# Patient Record
Sex: Female | Born: 1990 | Race: Black or African American | Hispanic: No | Marital: Single | Smoking: Never smoker
Health system: Southern US, Community
[De-identification: ages and names within clinical notes are randomized; demographics above are authoritative.]

## PROBLEM LIST (undated history)

## (undated) ENCOUNTER — Inpatient Hospital Stay (HOSPITAL_COMMUNITY): Payer: Self-pay

## (undated) DIAGNOSIS — A5901 Trichomonal vulvovaginitis: Secondary | ICD-10-CM

## (undated) DIAGNOSIS — Z8669 Personal history of other diseases of the nervous system and sense organs: Secondary | ICD-10-CM

## (undated) DIAGNOSIS — B009 Herpesviral infection, unspecified: Secondary | ICD-10-CM

## (undated) DIAGNOSIS — K8020 Calculus of gallbladder without cholecystitis without obstruction: Secondary | ICD-10-CM

## (undated) HISTORY — DX: Personal history of other diseases of the nervous system and sense organs: Z86.69

## (undated) HISTORY — PX: WISDOM TOOTH EXTRACTION: SHX21

## (undated) HISTORY — DX: Trichomonal vulvovaginitis: A59.01

## (undated) HISTORY — PX: TUBAL LIGATION: SHX77

## (undated) HISTORY — PX: NO PAST SURGERIES: SHX2092

## (undated) HISTORY — DX: Herpesviral infection, unspecified: B00.9

---
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
## 2008-01-28 ENCOUNTER — Inpatient Hospital Stay (HOSPITAL_COMMUNITY): Admission: AD | Admit: 2008-01-28 | Discharge: 2008-01-29 | Payer: Self-pay | Admitting: Obstetrics

## 2008-02-02 ENCOUNTER — Ambulatory Visit (HOSPITAL_COMMUNITY): Admission: RE | Admit: 2008-02-02 | Discharge: 2008-02-02 | Payer: Self-pay | Admitting: Obstetrics

## 2008-06-17 ENCOUNTER — Inpatient Hospital Stay (HOSPITAL_COMMUNITY): Admission: AD | Admit: 2008-06-17 | Discharge: 2008-06-17 | Payer: Self-pay | Admitting: Obstetrics

## 2008-06-19 ENCOUNTER — Inpatient Hospital Stay (HOSPITAL_COMMUNITY): Admission: AD | Admit: 2008-06-19 | Discharge: 2008-06-19 | Payer: Self-pay | Admitting: Obstetrics

## 2008-06-25 ENCOUNTER — Inpatient Hospital Stay (HOSPITAL_COMMUNITY): Admission: AD | Admit: 2008-06-25 | Discharge: 2008-06-26 | Payer: Self-pay | Admitting: Obstetrics

## 2008-06-27 ENCOUNTER — Inpatient Hospital Stay (HOSPITAL_COMMUNITY): Admission: AD | Admit: 2008-06-27 | Discharge: 2008-06-29 | Payer: Self-pay | Admitting: Obstetrics

## 2009-06-04 ENCOUNTER — Emergency Department (HOSPITAL_COMMUNITY): Admission: EM | Admit: 2009-06-04 | Discharge: 2009-06-04 | Payer: Self-pay | Admitting: Emergency Medicine

## 2010-07-23 LAB — RAPID STREP SCREEN (MED CTR MEBANE ONLY): Streptococcus, Group A Screen (Direct): NEGATIVE

## 2010-08-21 LAB — CBC
HCT: 35.4 % — ABNORMAL LOW (ref 36.0–46.0)
Hemoglobin: 9 g/dL — ABNORMAL LOW (ref 12.0–15.0)
MCHC: 32.9 g/dL (ref 30.0–36.0)
MCHC: 32.9 g/dL (ref 30.0–36.0)
Platelets: 189 10*3/uL (ref 150–400)
RBC: 4.26 MIL/uL (ref 3.87–5.11)
RDW: 15.2 % (ref 11.5–15.5)
WBC: 9.2 10*3/uL (ref 4.0–10.5)

## 2010-08-21 LAB — URINALYSIS, ROUTINE W REFLEX MICROSCOPIC
Glucose, UA: NEGATIVE mg/dL
Hgb urine dipstick: NEGATIVE
Ketones, ur: NEGATIVE mg/dL
Ketones, ur: NEGATIVE mg/dL
Specific Gravity, Urine: 1.015 (ref 1.005–1.030)
Urobilinogen, UA: 0.2 mg/dL (ref 0.0–1.0)
Urobilinogen, UA: 0.2 mg/dL (ref 0.0–1.0)
pH: 7.5 (ref 5.0–8.0)
pH: 6.5 (ref 5.0–8.0)

## 2010-08-21 LAB — COMPREHENSIVE METABOLIC PANEL
ALT: 10 U/L (ref 0–35)
AST: 20 U/L (ref 0–37)
Albumin: 3.3 g/dL — ABNORMAL LOW (ref 3.5–5.2)
Calcium: 9 mg/dL (ref 8.4–10.5)
GFR calc Af Amer: >60 mL/min (ref >60)
Sodium: 134 mEq/L — ABNORMAL LOW (ref 135–145)
Total Protein: 7.1 g/dL (ref 6.0–8.3)

## 2010-08-21 LAB — AMYLASE: Amylase: 143 U/L — ABNORMAL HIGH (ref 27–131)

## 2010-08-21 LAB — RPR: RPR Ser Ql: NONREACTIVE

## 2010-08-21 LAB — LIPASE, BLOOD: Lipase: 26 U/L (ref 11–59)

## 2010-11-09 ENCOUNTER — Inpatient Hospital Stay (HOSPITAL_COMMUNITY)
Admission: EM | Admit: 2010-11-09 | Discharge: 2010-11-09 | Disposition: A | Discharge status: Home / Self Care | Payer: Self-pay | Source: Ambulatory Visit | Attending: Obstetrics | Admitting: Obstetrics

## 2010-11-09 DIAGNOSIS — R112 Nausea with vomiting, unspecified: Secondary | ICD-10-CM

## 2010-11-09 DIAGNOSIS — R109 Unspecified abdominal pain: Secondary | ICD-10-CM

## 2010-11-09 LAB — URINALYSIS, ROUTINE W REFLEX MICROSCOPIC
Bilirubin Urine: NEGATIVE
Ketones, ur: NEGATIVE mg/dL
Nitrite: NEGATIVE
Specific Gravity, Urine: 1.025 (ref 1.005–1.030)
Urobilinogen, UA: 0.2 mg/dL (ref 0.0–1.0)

## 2010-11-09 LAB — CBC
MCH: 25.7 pg — ABNORMAL LOW (ref 26.0–34.0)
Platelets: 269 10*3/uL (ref 150–400)
RBC: 4.95 MIL/uL (ref 3.87–5.11)
WBC: 4.7 10*3/uL (ref 4.0–10.5)

## 2010-11-09 LAB — WET PREP, GENITAL
Clue Cells Wet Prep HPF POC: NONE SEEN
Trich, Wet Prep: NONE SEEN
Yeast Wet Prep HPF POC: NONE SEEN

## 2010-11-09 LAB — DIFFERENTIAL
Eosinophils Relative: 1 % (ref 0–5)
Lymphocytes Relative: 33 % (ref 12–46)
Lymphs Abs: 1.6 10*3/uL (ref 0.7–4.0)
Monocytes Absolute: 0.3 10*3/uL (ref 0.1–1.0)

## 2011-01-31 LAB — URINALYSIS, ROUTINE W REFLEX MICROSCOPIC
Hgb urine dipstick: NEGATIVE
Ketones, ur: NEGATIVE
Protein, ur: NEGATIVE
Urobilinogen, UA: 0.2

## 2011-01-31 LAB — HCG, QUANTITATIVE, PREGNANCY: hCG, Beta Chain, Quant, S: 1914 — ABNORMAL HIGH

## 2011-01-31 LAB — GC/CHLAMYDIA PROBE AMP, GENITAL: Chlamydia, DNA Probe: NEGATIVE

## 2011-01-31 LAB — CBC
HCT: 38.7
Platelets: 263
RDW: 14.4
WBC: 7.5

## 2011-01-31 LAB — POCT PREGNANCY, URINE: Preg Test, Ur: POSITIVE

## 2011-01-31 LAB — WET PREP, GENITAL
Trich, Wet Prep: NONE SEEN
Yeast Wet Prep HPF POC: NONE SEEN

## 2011-02-04 LAB — DIFFERENTIAL
Basophils Absolute: 0
Eosinophils Relative: 2
Lymphocytes Relative: 15
Lymphs Abs: 1.3
Monocytes Absolute: 0.6
Neutro Abs: 6.6

## 2011-02-04 LAB — GC/CHLAMYDIA PROBE AMP, GENITAL: GC Probe Amp, Genital: NEGATIVE

## 2011-02-04 LAB — URINALYSIS, ROUTINE W REFLEX MICROSCOPIC
Nitrite: NEGATIVE
Protein, ur: NEGATIVE
Specific Gravity, Urine: 1.015
Urobilinogen, UA: 0.2

## 2011-02-04 LAB — URINE CULTURE

## 2011-02-04 LAB — URINE MICROSCOPIC-ADD ON

## 2011-02-04 LAB — CBC
HCT: 34.2 — ABNORMAL LOW
Hemoglobin: 11.1 — ABNORMAL LOW
Platelets: 251
RDW: 14.6
WBC: 8.6

## 2011-02-04 LAB — WET PREP, GENITAL
Clue Cells Wet Prep HPF POC: NONE SEEN
Yeast Wet Prep HPF POC: NONE SEEN

## 2011-05-23 ENCOUNTER — Encounter (HOSPITAL_COMMUNITY): Payer: Self-pay | Admitting: Emergency Medicine

## 2011-05-23 ENCOUNTER — Emergency Department (HOSPITAL_COMMUNITY): Payer: No Typology Code available for payment source

## 2011-05-23 ENCOUNTER — Emergency Department (HOSPITAL_COMMUNITY)
Admission: EM | Admit: 2011-05-23 | Discharge: 2011-05-23 | Disposition: A | Discharge status: Home / Self Care | Payer: No Typology Code available for payment source | Attending: Emergency Medicine | Admitting: Emergency Medicine

## 2011-05-23 DIAGNOSIS — R079 Chest pain, unspecified: Secondary | ICD-10-CM | POA: Insufficient documentation

## 2011-05-23 DIAGNOSIS — S0083XA Contusion of other part of head, initial encounter: Secondary | ICD-10-CM

## 2011-05-23 DIAGNOSIS — S0003XA Contusion of scalp, initial encounter: Secondary | ICD-10-CM | POA: Insufficient documentation

## 2011-05-23 DIAGNOSIS — S1093XA Contusion of unspecified part of neck, initial encounter: Secondary | ICD-10-CM | POA: Insufficient documentation

## 2011-05-23 DIAGNOSIS — J329 Chronic sinusitis, unspecified: Secondary | ICD-10-CM | POA: Insufficient documentation

## 2011-05-23 DIAGNOSIS — R220 Localized swelling, mass and lump, head: Secondary | ICD-10-CM | POA: Insufficient documentation

## 2011-05-23 DIAGNOSIS — R51 Headache: Secondary | ICD-10-CM | POA: Insufficient documentation

## 2011-05-23 MED ORDER — MORPHINE SULFATE 4 MG/ML IJ SOLN
4.0000 mg | Freq: Once | INTRAMUSCULAR | Status: AC
  Administered 2011-05-23: 4 mg via INTRAMUSCULAR

## 2011-05-23 MED ORDER — MOMETASONE FUROATE 50 MCG/ACT NA SUSP: 2.0000 | Freq: Every day | NASAL | Status: DC

## 2011-05-23 MED ORDER — IBUPROFEN 600 MG PO TABS: 600.0000 mg | ORAL_TABLET | Freq: Four times a day (QID) | ORAL | Status: AC | PRN

## 2011-05-23 MED ORDER — OXYCODONE-ACETAMINOPHEN 5-325 MG PO TABS: 1.0000 | ORAL_TABLET | ORAL | Status: AC | PRN

## 2011-05-23 MED ORDER — MORPHINE SULFATE 4 MG/ML IJ SOLN
4.0000 mg | Freq: Once | INTRAMUSCULAR | Status: DC
  Filled 2011-05-23: qty 1

## 2011-05-23 MED ORDER — SODIUM CHLORIDE 0.9 % IV BOLUS (SEPSIS): 1000.0000 mL | Freq: Once | INTRAVENOUS | Status: DC

## 2011-05-23 MED ORDER — OXYMETAZOLINE HCL 0.05 % NA SOLN: 2.0000 | Freq: Two times a day (BID) | NASAL | Status: AC

## 2011-05-23 NOTE — ED Notes (Signed)
Pt refuses iv.

## 2011-05-23 NOTE — ED Notes (Signed)
PT ambulated with a steady gait; VSS; no signs of distress; respirations even and unlabored; no questions at this time. A&Ox3.
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
## 2012-06-04 ENCOUNTER — Emergency Department (HOSPITAL_COMMUNITY)
Admission: EM | Admit: 2012-06-04 | Discharge: 2012-06-04 | Disposition: A | Discharge status: Home / Self Care | Payer: BC Managed Care – PPO | Source: Home / Self Care

## 2012-12-10 ENCOUNTER — Encounter (HOSPITAL_COMMUNITY): Payer: Self-pay | Admitting: *Deleted

## 2012-12-10 ENCOUNTER — Inpatient Hospital Stay (HOSPITAL_COMMUNITY)
Admission: AD | Admit: 2012-12-10 | Discharge: 2012-12-10 | Disposition: A | Discharge status: Home / Self Care | Payer: BC Managed Care – PPO | Source: Ambulatory Visit | Attending: Obstetrics & Gynecology | Admitting: Obstetrics & Gynecology

## 2012-12-10 ENCOUNTER — Encounter (HOSPITAL_COMMUNITY): Payer: Self-pay | Admitting: Obstetrics & Gynecology

## 2012-12-10 DIAGNOSIS — O99891 Other specified diseases and conditions complicating pregnancy: Secondary | ICD-10-CM | POA: Insufficient documentation

## 2012-12-10 DIAGNOSIS — R109 Unspecified abdominal pain: Secondary | ICD-10-CM | POA: Insufficient documentation

## 2012-12-10 DIAGNOSIS — O9989 Other specified diseases and conditions complicating pregnancy, childbirth and the puerperium: Secondary | ICD-10-CM | POA: Insufficient documentation

## 2012-12-10 DIAGNOSIS — K8020 Calculus of gallbladder without cholecystitis without obstruction: Secondary | ICD-10-CM | POA: Insufficient documentation

## 2012-12-10 HISTORY — DX: Calculus of gallbladder without cholecystitis without obstruction: K80.20

## 2012-12-10 LAB — URINALYSIS, ROUTINE W REFLEX MICROSCOPIC
Bilirubin Urine: NEGATIVE
Glucose, UA: NEGATIVE mg/dL
Ketones, ur: NEGATIVE mg/dL
Protein, ur: NEGATIVE mg/dL
pH: 7.5 (ref 5.0–8.0)

## 2012-12-10 NOTE — MAU Note (Signed)
abd pain, lower abd cramping, now starting to have pain in left side.  Has hx of gallstones- was advised to have surgery after last del (5 yrs ago).  Never had surgery, now starting to have more nausea.

## 2012-12-10 NOTE — MAU Provider Note (Signed)
  History     CSN: [id number]  Arrival date and time: 12/10/12 1059   None     Chief Complaint  Patient presents with  . Abdominal Pain   HPI G2P1001, 5 wks by LMP. Came for mild pelvic cramping but didn't call office though she was seen in office endo of Jul'14 for SPT.  No vag bleeding, no UTI symptoms, no bowel complaints. Does not tolerate food, vomits and feels better but denies epigastric or RUQ pain or GERD symptoms. Denies fever/chills/ pain with food.  I think she is concerned about her Gall stones what gave her pain in last pregnancy 4 yrs back, didn't see Gen Surgery after delivery due to loss of insurance and has not made appointment to see them since she has been insured before this pregnancy was diagnosed. She wants to get checked for gall stones and wants cholecystectomy ASAP since she had pain in 3rd trim and was not a surgical candidate then.   Also c/o left flank/ side wall pain. NO constipation, no renal stone hx, no hematuria/ dysuria/    Past Medical History  Diagnosis Date  . Gall stones     History reviewed. No pertinent past surgical history.  History reviewed. No pertinent family history.  History  Substance Use Topics  . Smoking status: Never Smoker   . Smokeless tobacco: Not on file  . Alcohol Use: No    Allergies: No Known Allergies  Prescriptions prior to admission  Medication Sig Dispense Refill  . Prenatal Vit-Fe Fumarate-FA (PRENATAL MULTIVITAMIN) TABS tablet Take 1 tablet by mouth daily at 12 noon.        ROS Physical Exam   Blood pressure 119/52, pulse 97, temperature 98.2 F (36.8 C), temperature source Oral, resp. rate 16, height 5\' 1"  (1.549 m), weight 126 lb (57.153 kg), last menstrual period 11/02/2012.  Physical Exam A&O x 3, no acute distress. Pleasant, afebrile HEENT neg CV RRR Abdo soft, non tender, non acute. No CVA tenderness bilateral, but left paraspinal muscle tendernes Extr no edema/ tenderness Pelvic Uterus and  cervix non tender, no CMT, uterus soft but feels normal size.   MAU Course  Procedures No sono needed, Will need NPO gall stone protocol sono and Gen Surg referral outpatient, no indication for acute pain or infection.    Assessment and Plan  Early pregnancy, 4-5 wks, needs office visit to assess pregnancy/ viability if pain worse or bleeding. F/up with Dr Seymour Bars in office  Needs outpt GB sono and GenSurg consult, usually advisable to await surg until 2nd trim if indicated, pt very anxious about gall stone pain she had in last pregnancy.    Beau Vanduzer R 12/10/2012, 12:30 PM

## 2012-12-12 LAB — OB RESULTS CONSOLE GC/CHLAMYDIA
Chlamydia: NEGATIVE
GC PROBE AMP, GENITAL: NEGATIVE

## 2012-12-14 ENCOUNTER — Other Ambulatory Visit: Payer: Self-pay | Admitting: Obstetrics & Gynecology

## 2012-12-14 DIAGNOSIS — R102 Pelvic and perineal pain: Secondary | ICD-10-CM

## 2012-12-14 DIAGNOSIS — R109 Unspecified abdominal pain: Secondary | ICD-10-CM

## 2012-12-21 ENCOUNTER — Ambulatory Visit
Admission: RE | Admit: 2012-12-21 | Discharge: 2012-12-21 | Disposition: A | Discharge status: Home / Self Care | Payer: BC Managed Care – PPO | Source: Ambulatory Visit | Attending: Obstetrics & Gynecology | Admitting: Obstetrics & Gynecology

## 2012-12-21 DIAGNOSIS — R102 Pelvic and perineal pain: Secondary | ICD-10-CM

## 2012-12-21 DIAGNOSIS — R109 Unspecified abdominal pain: Secondary | ICD-10-CM

## 2013-01-12 LAB — OB RESULTS CONSOLE RUBELLA ANTIBODY, IGM: RUBELLA: IMMUNE

## 2013-01-12 LAB — OB RESULTS CONSOLE ANTIBODY SCREEN: ANTIBODY SCREEN: POSITIVE

## 2013-01-12 LAB — OB RESULTS CONSOLE HEPATITIS B SURFACE ANTIGEN: HEP B S AG: NEGATIVE

## 2013-01-12 LAB — OB RESULTS CONSOLE ABO/RH: RH TYPE: POSITIVE

## 2013-01-12 LAB — OB RESULTS CONSOLE HIV ANTIBODY (ROUTINE TESTING): HIV: NONREACTIVE

## 2013-01-12 LAB — OB RESULTS CONSOLE RPR: RPR: NONREACTIVE

## 2013-05-27 ENCOUNTER — Encounter (HOSPITAL_COMMUNITY): Payer: Self-pay | Admitting: *Deleted

## 2013-05-27 ENCOUNTER — Inpatient Hospital Stay (HOSPITAL_COMMUNITY)
Admission: AD | Admit: 2013-05-27 | Discharge: 2013-05-29 | DRG: 778 | Disposition: A | Discharge status: Home / Self Care | Payer: Medicaid Other | Source: Ambulatory Visit | Attending: Obstetrics and Gynecology | Admitting: Obstetrics and Gynecology

## 2013-05-27 DIAGNOSIS — O30009 Twin pregnancy, unspecified number of placenta and unspecified number of amniotic sacs, unspecified trimester: Secondary | ICD-10-CM | POA: Diagnosis present

## 2013-05-27 DIAGNOSIS — O329XX0 Maternal care for malpresentation of fetus, unspecified, not applicable or unspecified: Secondary | ICD-10-CM

## 2013-05-27 DIAGNOSIS — R109 Unspecified abdominal pain: Secondary | ICD-10-CM | POA: Diagnosis present

## 2013-05-27 DIAGNOSIS — O3090 Multiple gestation, unspecified, unspecified trimester: Secondary | ICD-10-CM | POA: Diagnosis present

## 2013-05-27 DIAGNOSIS — O4700 False labor before 37 completed weeks of gestation, unspecified trimester: Principal | ICD-10-CM | POA: Diagnosis present

## 2013-05-27 DIAGNOSIS — O30049 Twin pregnancy, dichorionic/diamniotic, unspecified trimester: Secondary | ICD-10-CM

## 2013-05-27 LAB — URINALYSIS, ROUTINE W REFLEX MICROSCOPIC
BILIRUBIN URINE: NEGATIVE
GLUCOSE, UA: NEGATIVE mg/dL
Hgb urine dipstick: NEGATIVE
KETONES UR: NEGATIVE mg/dL
Leukocytes, UA: NEGATIVE
Nitrite: NEGATIVE
PH: 6 (ref 5.0–8.0)
PROTEIN: NEGATIVE mg/dL
Specific Gravity, Urine: >1.03 — ABNORMAL HIGH (ref 1.005–1.030)
Urobilinogen, UA: 0.2 mg/dL (ref 0.0–1.0)

## 2013-05-27 LAB — FETAL FIBRONECTIN: Fetal Fibronectin: NEGATIVE

## 2013-05-27 LAB — WET PREP, GENITAL
CLUE CELLS WET PREP: NONE SEEN
TRICH WET PREP: NONE SEEN
YEAST WET PREP: NONE SEEN

## 2013-05-27 MED ORDER — NIFEDIPINE 10 MG PO CAPS
20.0000 mg | ORAL_CAPSULE | Freq: Four times a day (QID) | ORAL | Status: DC
  Administered 2013-05-27 – 2013-05-28 (×2): 20 mg via ORAL
  Filled 2013-05-27 (×4): qty 2

## 2013-05-27 MED ORDER — LACTATED RINGERS IV BOLUS (SEPSIS)
1000.0000 mL | Freq: Once | INTRAVENOUS | Status: AC
  Administered 2013-05-27: 1000 mL via INTRAVENOUS

## 2013-05-27 MED ORDER — CALCIUM CARBONATE ANTACID 500 MG PO CHEW: 2.0000 | CHEWABLE_TABLET | ORAL | Status: DC | PRN

## 2013-05-27 MED ORDER — ZOLPIDEM TARTRATE 5 MG PO TABS: 5.0000 mg | ORAL_TABLET | Freq: Every evening | ORAL | Status: DC | PRN

## 2013-05-27 MED ORDER — NIFEDIPINE 10 MG PO CAPS
10.0000 mg | ORAL_CAPSULE | Freq: Once | ORAL | Status: AC
  Administered 2013-05-27: 10 mg via ORAL
  Filled 2013-05-27: qty 1

## 2013-05-27 MED ORDER — LACTATED RINGERS IV SOLN
INTRAVENOUS | Status: DC
  Administered 2013-05-28 (×2): via INTRAVENOUS

## 2013-05-27 MED ORDER — DOCUSATE SODIUM 100 MG PO CAPS
100.0000 mg | ORAL_CAPSULE | Freq: Every day | ORAL | Status: DC
  Administered 2013-05-28 – 2013-05-29 (×2): 100 mg via ORAL
  Filled 2013-05-27 (×2): qty 1

## 2013-05-27 MED ORDER — ACETAMINOPHEN 325 MG PO TABS
650.0000 mg | ORAL_TABLET | ORAL | Status: DC | PRN
  Administered 2013-05-27 – 2013-05-28 (×3): 650 mg via ORAL
  Filled 2013-05-27 (×2): qty 2

## 2013-05-27 MED ORDER — PRENATAL MULTIVITAMIN CH
1.0000 | ORAL_TABLET | Freq: Every day | ORAL | Status: DC
  Filled 2013-05-27: qty 1

## 2013-05-27 MED ORDER — BETAMETHASONE SOD PHOS & ACET 6 (3-3) MG/ML IJ SUSP
12.0000 mg | INTRAMUSCULAR | Status: AC
Start: 2013-05-27 — End: 2013-05-28
  Administered 2013-05-27 – 2013-05-28 (×2): 12 mg via INTRAMUSCULAR
  Filled 2013-05-27 (×2): qty 2

## 2013-05-27 MED ORDER — NIFEDIPINE 10 MG PO CAPS
10.0000 mg | ORAL_CAPSULE | ORAL | Status: DC
  Administered 2013-05-27 (×2): 10 mg via ORAL
  Filled 2013-05-27 (×2): qty 1

## 2013-05-27 NOTE — MAU Provider Note (Signed)
  History     CSN: [id number]  Arrival date and time: 05/27/13 1742   None     Chief Complaint  Patient presents with  . Labor Eval   HPI Pt at 29 3/7 wks with twins presents to MAU c/o ctxs that started last night.  No LOF, VB and good FM.  OB History   Grav Para Term Preterm Abortions TAB SAB Ect Mult Living   2 1 1  0 0 0 0 0 0 1      Past Medical History  Diagnosis Date  . Gall stones     Past Surgical History  Procedure Laterality Date  . Wisdom tooth extraction      History reviewed. No pertinent family history.  History  Substance Use Topics  . Smoking status: Never Smoker   . Smokeless tobacco: Not on file  . Alcohol Use: No    Allergies: No Known Allergies  Prescriptions prior to admission  Medication Sig Dispense Refill  . Prenatal Vit-Fe Fumarate-FA (PRENATAL MULTIVITAMIN) TABS tablet Take 1 tablet by mouth daily at 12 noon.        ROS Physical Exam   Blood pressure 121/53, pulse 120, temperature 98.3 F (36.8 C), temperature source Oral, resp. rate 16, last menstrual period 11/02/2012.  Physical Exam  VE per RN Darel Hong(Judy) 0/thick/-2 FHT Cat 1 x 2 Toco q 5-9 min  MAU Course  Procedures  UA concentrated but no ketones FFN pending IVF   Assessment and Plan  G2P1 with twins at 5929 3/7 wks presenting for PTL eval.  Will hydrate, give procardia and check UA and FFN.  Fetal status is overall reassuring x 2.  If FFN+, BMZ.  If ctxs persist, rec admit for mg and BMZ.  I have discussed with oncoming CNM and MD.  Purcell NailsOBERTS,Zanaiya Calabria Y 05/27/2013, 6:40 PM

## 2013-05-27 NOTE — MAU Provider Note (Signed)
History   23yo, G2P1001 at 4031w3d, twin gestation (Di/Di) presents with UCs.  Initally assessed by Dr. Su Hiltoberts.  This is a progress note.   Physical Exam   Blood pressure 121/53, pulse 120, temperature 98.3 F (36.8 C), temperature source Oral, resp. rate 16, last menstrual period 11/02/2012.  Physical Exam  Chest: Clear Heart: RRR Abdomen: gravid, NT Extremities: WNL  @ 1845 Dilation: Closed Effacement (%): Thick Cervical Position: Anterior Station: -2 Exam by:: Dorrene GermanJ. Lowe RN  @ [phone] Dilation: Closed Effacement (%): 0% Cervical Position: Anterior Station: Unable to determine Soft J. Beckey DowningOxley CNM  @ 2105   FHT: UCs:  ED Course  IUP twin gestation at 3431w3d PTL evaluation FFN neg IVF running  C/w Dr. Charlotta Newtonzan  Procardia 10mg  Q 20 min x 4 doses  Dr. Charlotta Newtonzan did BS u/s for position to aid in FHT of one twin  Admit per Dr. Mosie Epsteinzan   Quenton Recendez CNM, MSN 05/27/2013 8:19 PM

## 2013-05-27 NOTE — MAU Note (Signed)
Patient states she is having contractions every 5-7 minutes. Denies bleeding or leaking and reports good fetal movement.  

## 2013-05-27 NOTE — Progress Notes (Addendum)
S: Patient reporting increased pressure, feeling like she has pain in her vagina.  No VB, LOF, +FMx2  O: BP 107/54  Pulse 120  Temp(Src) 98.3 F (36.8 C) (Oral)  Resp 16  LMP 11/02/2012  Baby A: 140, moderate variability, +10x10 accels, no decels Baby B: 120, moderate variability, +10x10 accels, no decels Toco: irregular contractions, at times q 5min SVE: unchanged, closed, long, -3, soft, anterior  A/P: [id number]@23yoG2P1001@  779w3d with di/di twins admitted for preterm contractions -FWB- reassuring for gestational age  Once reactive FHT, ok for FHT q shift with continuous toco monitoring -Preterm Labor: cervix remains unchanged. Contractions remain, will continue treatment with Procardia  BMTZ course to be given, first dose @ 2240   Will hold on Magnesium for neuro protection as the patient's risk of imminent delivery is low  Continue IV fluids  Cultures obtained: GBS, GC/C, Wet prep obtained.  UA negative, FFN neg  Tylenol prn pain -Prenatal care: continue PNV, SCDs while in bed, ok for general diet  Myna HidalgoJennifer Webber Michiels, DO [phone] (pager) [phone] (office)

## 2013-05-28 LAB — CBC
HCT: 31.5 % — ABNORMAL LOW (ref 36.0–46.0)
Hemoglobin: 10.5 g/dL — ABNORMAL LOW (ref 12.0–15.0)
MCH: 25.4 pg — ABNORMAL LOW (ref 26.0–34.0)
MCHC: 33.3 g/dL (ref 30.0–36.0)
MCV: 76.3 fL — ABNORMAL LOW (ref 78.0–100.0)
Platelets: 223 10*3/uL (ref 150–400)
RBC: 4.13 MIL/uL (ref 3.87–5.11)
RDW: 14.3 % (ref 11.5–15.5)
WBC: 14.6 10*3/uL — ABNORMAL HIGH (ref 4.0–10.5)

## 2013-05-28 LAB — URINALYSIS, ROUTINE W REFLEX MICROSCOPIC
BILIRUBIN URINE: NEGATIVE
GLUCOSE, UA: NEGATIVE mg/dL
HGB URINE DIPSTICK: NEGATIVE
KETONES UR: 15 mg/dL — AB
Leukocytes, UA: NEGATIVE
Nitrite: NEGATIVE
PH: 7 (ref 5.0–8.0)
Protein, ur: NEGATIVE mg/dL
SPECIFIC GRAVITY, URINE: 1.025 (ref 1.005–1.030)
Urobilinogen, UA: 0.2 mg/dL (ref 0.0–1.0)

## 2013-05-28 LAB — GC/CHLAMYDIA PROBE AMP
CT Probe RNA: NEGATIVE
GC Probe RNA: NEGATIVE

## 2013-05-28 LAB — TYPE AND SCREEN
ABO/RH(D): O POS
ANTIBODY SCREEN: NEGATIVE

## 2013-05-28 LAB — ABO/RH: ABO/RH(D): O POS

## 2013-05-28 MED ORDER — BUTORPHANOL TARTRATE 1 MG/ML IJ SOLN
1.0000 mg | Freq: Once | INTRAMUSCULAR | Status: AC
  Administered 2013-05-28: 1 mg via INTRAVENOUS
  Filled 2013-05-28: qty 1

## 2013-05-28 MED ORDER — MAGNESIUM SULFATE BOLUS VIA INFUSION
4.0000 g | Freq: Once | INTRAVENOUS | Status: AC
  Administered 2013-05-28: 4 g via INTRAVENOUS
  Filled 2013-05-28: qty 500

## 2013-05-28 MED ORDER — CYCLOBENZAPRINE HCL 10 MG PO TABS: 5.0000 mg | ORAL_TABLET | Freq: Three times a day (TID) | ORAL | Status: DC | PRN

## 2013-05-28 MED ORDER — MAGNESIUM SULFATE 40 G IN LACTATED RINGERS - SIMPLE
3.0000 g/h | INTRAVENOUS | Status: DC
  Administered 2013-05-29: 3 g/h via INTRAVENOUS
  Filled 2013-05-28 (×2): qty 500

## 2013-05-28 NOTE — Progress Notes (Signed)
Up to void

## 2013-05-28 NOTE — Progress Notes (Signed)
At the bedside, attempting to monitor fhr's

## 2013-05-28 NOTE — Progress Notes (Signed)
Called to see patient due to pain in RL back/flank.  Reports same pain as previous experienced and worsens with contractions, but persistent between contractions as a dull pain. No dysuria, discharge, or bleeding.  No nausea or vomiting.   Abdomen soft b/t contractions No clear CVAT, but tender to touch over right pelvic brim SVE: Closed/Thick/Soft---unchanged  Dr. Su Hiltoberts consulted Will start MgSO4 UA sent Flexeril ordered POC discussed with patient.  Verbalized understanding   Nigel BridgemanVicki Cheyane Ayon, CNM 05/28/2013  1308

## 2013-05-28 NOTE — H&P (Signed)
Jeanne Stark is a 23 y.o. female, G2P1001 at 1618w4d weeks with Di/Di twin gestation, reporting increased pressure, feeling like she has pain in her vagina. No VB, LOF, +FMx2  Patient Active Problem List   Diagnosis Date Noted  . Threatened preterm labor 05/27/2013    History of present pregnancy: Patient entered care at 13 weeks.   EDC of 08/09/13 was established by LMP.   Anatomy scan:  18 weeks, with normal findings and a posterior placenta for both twins.   Additional US evaluations:  28 weeks for growth - Di/Di twin gestation; Twin A - breech, posterior placenta, normal fluid, EFW 58th%ile; Twin B - Breech, posterior placenta, normal fluid, EFW 46th%ile.   Significant prenatal events:  UCs reported at 3566w1d ROB   Last evaluation:  05/18/13 at 4966w1d         0 cm / 0 %  OB History   Grav Para Term Preterm Abortions TAB SAB Ect Mult Living   2 1 1  0 0 0 0 0 0 1     Past Medical History  Diagnosis Date  . Gall stones    Past Surgical History  Procedure Laterality Date  . Wisdom tooth extraction    . No past surgeries     Family History: family history is not on file. Social History:  reports that she has never smoked. She does not have any smokeless tobacco history on file. She reports that she does not drink alcohol or use illicit drugs.   Prenatal Transfer Tool  Maternal Diabetes: No Genetic Screening: Normal Maternal Ultrasounds/Referrals: Normal Fetal Ultrasounds or other Referrals:  None Maternal Substance Abuse:  No Significant Maternal Medications:  None Significant Maternal Lab Results: None    ROS: see HPI above, all other systems are negative   No Known Allergies   Dilation: Closed Effacement (%): Thick Station: -2 Exam by:: Dorrene GermanJ. Lowe RN Blood pressure 130/59, pulse 110, temperature 97.9 F (36.6 C), temperature source Oral, resp. rate 20, last menstrual period 11/02/2012.  Chest clear Heart RRR without murmur Abd gravid, NT Ext: WNL   Prenatal  labs: ABO, Rh:  O pos Antibody:  Pos Rubella:   Immune RPR:   Neg HBsAg:   Neg HIV:   Neg GBS:  not yet collected Sickle cell/Hgb electrophoresis:  Normal study Pap:  08/31/12 WNL GC:  Neg Chlamydia:  Neg Genetic screenings:  AFP WNL Glucola:  91 Other:  None    Assessment/Plan: IUP at 6968w3d with di/di twins  Preterm contractions   Admit to Antenatal per c/w Dr. Charlotta Newtonzan Once reactive FHT, ok for FHT q shift with continuous toco monitoring  Procardia 20 mg Q 6 hours  BMTZ course to be given, first dose @ 2240  Continue IV fluids  Cultures obtained: GBS, GC/CT, Wet prep   Tylenol prn pain    Rowan BlaseXLEY, JENNIFERCNM, MSN 05/28/2013, 3:40 AM

## 2013-05-28 NOTE — Progress Notes (Signed)
Per CNM, Hold Procardia and will order Magnesium sulfate

## 2013-05-28 NOTE — Progress Notes (Addendum)
Hospital day # 1 pregnancy at 8941w4d--twins preterm contractions.  S:  Feeling better.  Reports active fetuses. Reports minimal awareness of contractions.   O: BP 102/39  Pulse 119  Temp(Src) 98.7 F (37.1 C) (Oral)  Resp 20  Ht 5\' 1"  (1.549 m)  Wt 132 lb (59.875 kg)  BMI 24.95 kg/m2  LMP 11/02/2012 Filed Vitals:   05/27/13 2204 05/27/13 2326 05/28/13 0648 05/28/13 0835  BP: 121/65 130/59 114/48 102/39  Pulse: 116 110 119 119  Temp: 97.9 F (36.6 C)  98.5 F (36.9 C) 98.7 F (37.1 C)  TempSrc: Oral  Oral Oral  Resp: 18 20 20    Height:  5\' 1"  (1.549 m)    Weight:  132 lb (59.875 kg)          Fetal tracings:       Contractions:  Mild,  Irregular contractions 2-677minutes      Uterus non-tender      Extremities: extremities normal, atraumatic, no cyanosis or edema and no significant edema and no signs of DVT          Labs:  Fetal Fibronectin Negative        Meds: S/P 1 dose BMZ--next dose due 2240 1/23 Scheduled Meds: . betamethasone acetate-betamethasone sodium phosphate  12 mg Intramuscular Q24H  . docusate sodium  100 mg Oral Daily  . NIFEdipine  20 mg Oral Q6H  . prenatal multivitamin  1 tablet Oral Q1200   Continuous Infusions: . lactated ringers 125 mL/hr at 05/28/13 0253   PRN Meds:.acetaminophen, calcium carbonate, zolpidem  A: 4341w4d with twins, preterm contractions  gradually improving  P: Continue current plan of care      Upcoming tests/treatments:  Awaiting GBS and cultures      Complete BMZ course--Will consult with Dr. Su Hiltoberts regarding POC      MDs will follow  Nigel BridgemanLATHAM, VICKI CNM, [place] 05/28/2013 8:59 AM   Pt is uncomfortable with right sided low back pain and is feeling the ctxs.  Will start mg until 2nd dose of BMZ and plan to d/c tomorrow with possible d/c home.  Her cervical exam now is unchanged.  Pt says nothing really helps but she would like flexeril as an option.  Will order.  She had stadol earlier which just made her sleepy.  Last dose of  procardia was at 6am.

## 2013-05-28 NOTE — Progress Notes (Signed)
At the bedside for 50 minutes [facility]

## 2013-05-29 MED ORDER — NIFEDIPINE 10 MG PO CAPS: 10.0000 mg | ORAL_CAPSULE | ORAL | Status: DC | PRN

## 2013-05-29 NOTE — Discharge Summary (Signed)
Physician Discharge Summary  Patient ID: Tama HighBrittany N Seier MRN: [id number] DOB/AGE: [birth year] 23 y.o.  Admit date: 05/27/2013 Discharge date: 05/29/2013  Admission Diagnoses: Twins at 4729 + wks with preterm contractions  Discharge Diagnoses:  Active Problems:   Threatened preterm labor   Discharged Condition: good  Hospital Course: Pt admitted and given procardia and BMZ after presenting with regular contractions.  Contractions persisted regularly with rt low back pain so pt was started on magnesium for therapeutic relief.  The contracted subsided and pt's rt low back pain improved significantly.  Cervix was closed.  FFN neg, GC/CT both neg and GBS neg.  Consults: none  Significant Diagnostic Studies: n/a  Treatments: IV hydration and BMZ and magnesium  Discharge Exam: Blood pressure 119/59, pulse 113, temperature 98.1 F (36.7 C), temperature source Oral, resp. rate 18, height 5\' 1"  (1.549 m), weight 59.875 kg (132 lb), last menstrual period 11/02/2012, SpO2 98.00%. General appearance: alert and no distress Resp: clear to auscultation bilaterally Cardio: regular rate and rhythm GI: gravid, NT Extremities: Homans sign is negative, no sign of DVT  Disposition: 01-Home or Self Care     Medication List         cyclobenzaprine 10 MG tablet  Commonly known as:  FLEXERIL  Take 10 mg by mouth daily as needed for muscle spasms.     NIFEdipine 10 MG capsule  Commonly known as:  PROCARDIA  Take 1 capsule (10 mg total) by mouth every 4 (four) hours as needed.     prenatal multivitamin Tabs tablet  Take 1 tablet by mouth daily at 12 noon.           Follow-up Information   [facility]. (this week as scheduled)    Specialty:  Obstetrics and Gynecology   Contact information:   [address] [place] [id number] [phone]      Signed: Purcell NailsROBERTS,Kortny Lirette Y 05/29/2013, 2:38 PM

## 2013-05-29 NOTE — Progress Notes (Signed)
Discharge information given and pt verlbalizes understanding.  PTL s/s reviewed as well as medications.

## 2013-05-29 NOTE — Progress Notes (Signed)
Hospital day # 2 pregnancy at 6258w5d--Di/di twins, PT UCs  S:  Feeling drained, tired, but definitely more comfortable.  No back pain, contractions mild.  On Mg since 12:40pm, with patient on 3 gm since 5pm due to increased UCs on 2 gm.  Reports minimal UCs.  Denies SOB, chest pain, leaking, or bleeding.  Wants to see daughter.    O: BP 108/86  Pulse 111  Temp(Src) 97.8 F (36.6 C) (Oral)  Resp 20  Ht 5\' 1"  (1.549 m)  Wt 132 lb (59.875 kg)  BMI 24.95 kg/m2  SpO2 99%  LMP 11/02/2012      Fetal tracings:  Category 1 x 2      Contractions:   1-2/hour      Uterus non-tender      Extremities: no significant edema and no signs of DVT      Chest clear      Heart RRR without murmur      Ext DTR 1+, no edema.  SCDs on.          Labs:  GC/chlamydia negative, GBS pending.       Meds: Magnesium sulfate 3 gm/hour                 Completed betamethasone course last night at 2216  A: 2758w5d with di/di twins, PT UCs     Stable--on magnesium  P: Continue current plan of care      Per consult with Dr. Su Hiltoberts, will decrease magnesium to 2.5 gm/hr x 2             hours, then to 2 gm/hr x 2 hours, then off, with observation of contraction           activity as tapering occurs.     Reviewed possibility of d/c home if UCs remain stable--if patient has to               remain, will work with Security/nursing to facilitate daughter visiting under flu      restrictions.      MDs will follow.  Nigel BridgemanLATHAM, Izek Corvino CNM, [place] 05/29/2013 8:14 AM

## 2013-05-30 ENCOUNTER — Encounter (HOSPITAL_COMMUNITY): Payer: Self-pay

## 2013-05-30 ENCOUNTER — Inpatient Hospital Stay (HOSPITAL_COMMUNITY)
Admission: AD | Admit: 2013-05-30 | Discharge: 2013-05-30 | Disposition: A | Discharge status: Home / Self Care | Payer: Medicaid Other | Source: Ambulatory Visit | Attending: Obstetrics and Gynecology | Admitting: Obstetrics and Gynecology

## 2013-05-30 DIAGNOSIS — O4700 False labor before 37 completed weeks of gestation, unspecified trimester: Secondary | ICD-10-CM | POA: Insufficient documentation

## 2013-05-30 DIAGNOSIS — O30049 Twin pregnancy, dichorionic/diamniotic, unspecified trimester: Secondary | ICD-10-CM | POA: Insufficient documentation

## 2013-05-30 DIAGNOSIS — M549 Dorsalgia, unspecified: Secondary | ICD-10-CM | POA: Insufficient documentation

## 2013-05-30 DIAGNOSIS — O30009 Twin pregnancy, unspecified number of placenta and unspecified number of amniotic sacs, unspecified trimester: Secondary | ICD-10-CM | POA: Insufficient documentation

## 2013-05-30 LAB — URINALYSIS, ROUTINE W REFLEX MICROSCOPIC
BILIRUBIN URINE: NEGATIVE
GLUCOSE, UA: NEGATIVE mg/dL
Hgb urine dipstick: NEGATIVE
KETONES UR: NEGATIVE mg/dL
Nitrite: NEGATIVE
Protein, ur: NEGATIVE mg/dL
Specific Gravity, Urine: 1.015 (ref 1.005–1.030)
Urobilinogen, UA: 0.2 mg/dL (ref 0.0–1.0)
pH: 8.5 — ABNORMAL HIGH (ref 5.0–8.0)

## 2013-05-30 LAB — CULTURE, BETA STREP (GROUP B ONLY)

## 2013-05-30 LAB — URINE MICROSCOPIC-ADD ON

## 2013-05-30 MED ORDER — HYDROCODONE-ACETAMINOPHEN 5-325 MG PO TABS
2.0000 | ORAL_TABLET | Freq: Once | ORAL | Status: AC
  Administered 2013-05-30: 2 via ORAL
  Filled 2013-05-30: qty 2

## 2013-05-30 MED ORDER — HYDROCODONE-ACETAMINOPHEN 5-325 MG PO TABS: 1.0000 | ORAL_TABLET | Freq: Four times a day (QID) | ORAL | Status: DC | PRN

## 2013-05-30 MED ORDER — NIFEDIPINE 10 MG PO CAPS
20.0000 mg | ORAL_CAPSULE | Freq: Once | ORAL | Status: AC
  Administered 2013-05-30: 20 mg via ORAL
  Filled 2013-05-30: qty 2

## 2013-05-30 NOTE — MAU Note (Signed)
Pt presents with complaints of contractions. She states that she was discharged home yesterday from antenatal and was on magnesium while she was in the hospital. Denies any bleeding or LOF.

## 2013-05-30 NOTE — MAU Provider Note (Signed)
History   23 yo, G2P1001 at 7775w6d with Di/Di twin gestation.  Pt c/o constant back pain pointing at her midback, the pain then radiates around to her lower abdomen.  Pt denies dysuria or frequency.  Denies VB, LOF, recent fever, resp or GI c/o's, UTI or PIH s/s. GFM.  Discharged yesterday from Antenatal for similar complaints with 10 mg Procardia Q 4 hours PRN.  Recent FFN neg  Chief Complaint  Patient presents with  . Contractions   HPI  OB History   Grav Para Term Preterm Abortions TAB SAB Ect Mult Living   2 1 1  0 0 0 0 0 0 1      Past Medical History  Diagnosis Date  . Gall stones     Past Surgical History  Procedure Laterality Date  . Wisdom tooth extraction    . No past surgeries      History reviewed. No pertinent family history.  History  Substance Use Topics  . Smoking status: Never Smoker   . Smokeless tobacco: Not on file  . Alcohol Use: No    Allergies: No Known Allergies  Prescriptions prior to admission  Medication Sig Dispense Refill  . cyclobenzaprine (FLEXERIL) 10 MG tablet Take 10 mg by mouth daily as needed for muscle spasms.      Marland Kitchen. NIFEdipine (PROCARDIA) 10 MG capsule Take 1 capsule (10 mg total) by mouth every 4 (four) hours as needed.  30 capsule  0  . Prenatal Vit-Fe Fumarate-FA (PRENATAL MULTIVITAMIN) TABS tablet Take 1 tablet by mouth daily at 12 noon.        Review of Systems  Constitutional: Negative for fever and chills.  HENT: Negative.   Eyes: Negative.   Respiratory: Negative.   Cardiovascular: Negative.   Gastrointestinal: Negative.   Genitourinary: Negative for dysuria, urgency and frequency.       + CVAT, unclear if this is renal or muscular  Musculoskeletal: Positive for back pain.       Pt points to midback, described as constant pain, not cramping.  Skin: Negative.   Neurological: Negative.   Endo/Heme/Allergies: Negative.   Psychiatric/Behavioral: Negative.    Physical Exam   Blood pressure 129/70, pulse 107, resp.  rate 18, last menstrual period 11/02/2012.  Physical Exam  Constitutional: She is oriented to person, place, and time. She appears well-developed and well-nourished. She appears distressed.  Cardiovascular: Normal rate.   Respiratory: Effort normal.  GI: Soft.  Neurological: She is alert and oriented to person, place, and time.  Skin: Skin is warm and dry.  Psychiatric: She has a normal mood and affect. Her behavior is normal.   Dilation: Closed Effacement (%): 0 Cervical Position: Posterior Exam by:: J. Carolyna Yerian CNM  FHT: Reactive x2 UCs: Occasional after Procardia   ED Course  IUP at 6775w6d Preterm contractions Back pain  PO hydration 2 Vicodin - relieved pain and allowed pt to sleep 20 mg Procardia - UCs spaced out. Urine culture - pending  C/w Dr. Estanislado Pandyivard D/c home with precautions including pelvic rest Vicodin 1-2 Q 6 hours PRN rx given (already has an rx for Flexeril) Already scheduled for ROB on Tuesday     Haroldine LawsOXLEY, Eulonda Andalon CNM, MSN 05/30/2013 4:27 PM

## 2013-05-30 NOTE — Discharge Instructions (Signed)
Pelvic Rest °Pelvic rest is sometimes recommended for women when:  °· The placenta is partially or completely covering the opening of the cervix (placenta previa). °· There is bleeding between the uterine wall and the amniotic sac in the first trimester (subchorionic hemorrhage). °· The cervix begins to open without labor starting (incompetent cervix, cervical insufficiency). °· The labor is too early (preterm labor). °HOME CARE INSTRUCTIONS °· Do not have sexual intercourse, stimulation, or an orgasm. °· Do not use tampons, douche, or put anything in the vagina. °· Do not lift anything over 10 pounds (4.5 kg). °· Avoid strenuous activity or straining your pelvic muscles. °SEEK MEDICAL CARE IF:  °· You have any vaginal bleeding during pregnancy. Treat this as a potential emergency. °· You have cramping pain felt low in the stomach (stronger than menstrual cramps). °· You notice vaginal discharge (watery, mucus, or bloody). °· You have a low, dull backache. °· There are regular contractions or uterine tightening. °SEEK IMMEDIATE MEDICAL CARE IF: °You have vaginal bleeding and have placenta previa.  °Document Released: 08/17/2010 Document Revised: 07/15/2011 Document Reviewed: 08/17/2010 °ExitCare® Patient Information ©2014 [employer]. ° °Preterm Labor Information °Preterm labor is when labor starts before you are [redacted] weeks pregnant. The normal length of pregnancy is 39 to 41 weeks.  °CAUSES  °The cause of preterm labor is not often known. The most common known cause is infection. °RISK FACTORS °· Having a history of preterm labor. °· Having your water break before it should. °· Having a placenta that covers the opening of the cervix. °· Having a placenta that breaks away from the uterus. °· Having a cervix that is too weak to hold the baby in the uterus. °· Having too much fluid in the amniotic sac. °· Taking drugs or smoking while pregnant. °· Not gaining enough weight while pregnant. °· Being younger than 18 and  older than 25 years old. °· Having a low income. °· Being African American. °SYMPTOMS °· Period-like cramps, belly (abdominal) pain, or back pain. °· Contractions that are regular, as often as six in an hour. They may be mild or painful. °· Contractions that start at the top of the belly. They then move to the lower belly and back. °· Lower belly pressure that seems to get stronger. °· Bleeding from the vagina. °· Fluid leaking from the vagina. °TREATMENT  °Treatment depends on: °· Your condition. °· The condition of your baby. °· How many weeks pregnant you are. °Your doctor may have you: °· Take medicine to stop contractions. °· Stay in bed except to use the restroom (bed rest). °· Stay in the hospital. °WHAT SHOULD YOU DO IF YOU THINK YOU ARE IN PRETERM LABOR? °Call your doctor right away. You need to go to the hospital right away.  °HOW CAN YOU PREVENT PRETERM LABOR IN FUTURE PREGNANCIES? °· Stop smoking, if you smoke. °· Maintain healthy weight gain. °· Do not take drugs or be around chemicals that are not needed. °· Tell your doctor if you think you have an infection. °· Tell your doctor if you had a preterm labor before. °Document Released: 07/19/2008 Document Revised: 02/10/2013 Document Reviewed: 07/19/2008 °ExitCare® Patient Information ©2014 [employer]. ° °

## 2013-05-31 LAB — CULTURE, OB URINE
CULTURE: NO GROWTH
Colony Count: NO GROWTH

## 2013-06-21 ENCOUNTER — Inpatient Hospital Stay (HOSPITAL_COMMUNITY)
Admission: AD | Admit: 2013-06-21 | Discharge: 2013-06-21 | Disposition: A | Discharge status: Home / Self Care | Payer: Medicaid Other | Source: Ambulatory Visit | Attending: Obstetrics and Gynecology | Admitting: Obstetrics and Gynecology

## 2013-06-21 DIAGNOSIS — O30009 Twin pregnancy, unspecified number of placenta and unspecified number of amniotic sacs, unspecified trimester: Secondary | ICD-10-CM | POA: Insufficient documentation

## 2013-06-21 DIAGNOSIS — O30049 Twin pregnancy, dichorionic/diamniotic, unspecified trimester: Secondary | ICD-10-CM | POA: Insufficient documentation

## 2013-06-21 DIAGNOSIS — O99891 Other specified diseases and conditions complicating pregnancy: Secondary | ICD-10-CM | POA: Diagnosis not present

## 2013-06-21 DIAGNOSIS — R768 Other specified abnormal immunological findings in serum: Secondary | ICD-10-CM | POA: Diagnosis not present

## 2013-06-21 DIAGNOSIS — O9989 Other specified diseases and conditions complicating pregnancy, childbirth and the puerperium: Secondary | ICD-10-CM

## 2013-06-21 DIAGNOSIS — R7689 Other specified abnormal immunological findings in serum: Secondary | ICD-10-CM | POA: Diagnosis not present

## 2013-06-21 DIAGNOSIS — O4700 False labor before 37 completed weeks of gestation, unspecified trimester: Secondary | ICD-10-CM | POA: Insufficient documentation

## 2013-06-21 DIAGNOSIS — Z22322 Carrier or suspected carrier of Methicillin resistant Staphylococcus aureus: Secondary | ICD-10-CM

## 2013-06-21 DIAGNOSIS — IMO0001 Reserved for inherently not codable concepts without codable children: Secondary | ICD-10-CM | POA: Diagnosis present

## 2013-06-21 DIAGNOSIS — K8020 Calculus of gallbladder without cholecystitis without obstruction: Secondary | ICD-10-CM | POA: Diagnosis not present

## 2013-06-21 DIAGNOSIS — M549 Dorsalgia, unspecified: Secondary | ICD-10-CM | POA: Diagnosis not present

## 2013-06-21 DIAGNOSIS — N949 Unspecified condition associated with female genital organs and menstrual cycle: Secondary | ICD-10-CM | POA: Insufficient documentation

## 2013-06-21 LAB — URINALYSIS, ROUTINE W REFLEX MICROSCOPIC
BILIRUBIN URINE: NEGATIVE
GLUCOSE, UA: NEGATIVE mg/dL
HGB URINE DIPSTICK: NEGATIVE
KETONES UR: NEGATIVE mg/dL
Leukocytes, UA: NEGATIVE
Nitrite: NEGATIVE
PH: 7 (ref 5.0–8.0)
Protein, ur: NEGATIVE mg/dL
SPECIFIC GRAVITY, URINE: 1.01 (ref 1.005–1.030)
Urobilinogen, UA: 0.2 mg/dL (ref 0.0–1.0)

## 2013-06-21 LAB — FETAL FIBRONECTIN: Fetal Fibronectin: NEGATIVE

## 2013-06-21 MED ORDER — TERBUTALINE SULFATE 1 MG/ML IJ SOLN
0.2500 mg | Freq: Once | INTRAMUSCULAR | Status: AC
  Administered 2013-06-21: 0.25 mg via SUBCUTANEOUS
  Filled 2013-06-21: qty 1

## 2013-06-21 NOTE — MAU Provider Note (Signed)
History   23 yo G2P1001 at 733 weeks with di/di twins presented after calling to report increased contractions, pelvic pressure, sharp vaginal pain11 since yesterday.  Took Procardia at 2am without benefit--has used prn.  Denies leaking or bleeding, reports +FM.  Denies HSV lesions or prodrome.  No IC since prior to last hospitalization.  Has been on BR at home  Admitted 1/23-1/24 for PTL--negative FFN, but persistent UCs and right back/flank pain.  On magnesium x 24 hours. Cultures and GBS negative then.  Received betamethasone.    Last visit 2/11--US EFW A 4+1, 26.5%ile, vtx, normal fluid, posterior placenta; B, 4+1, 26.5%ile, normal fluid, posterior placenta, cervix 3.5 cm long/closed.  Has declined TDaP and flu vaccine.  Patient Active Problem List   Diagnosis Date Noted  . 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 Back pain complicating pregnancy 06/21/2013  . Twins 06/21/2013  . Gallstones 06/21/2013  . 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 Threatened preterm labor 05/27/2013    Prenatal Transfer Tool  Maternal Diabetes: No Genetic Screening: Normal Quad screen Maternal Ultrasounds/Referrals: Normal twin growth on 06/16/13. Fetal Ultrasounds or other Referrals:  None Maternal Substance Abuse:  No Significant Maternal Medications:  Meds include: Other: Procardia for PTL Significant Maternal Lab Results: Lab values include: Group B Strep negative on 1/22    No chief complaint on file.  HPI:  See above  OB History   Grav Para Term Preterm Abortions TAB SAB Ect Mult Living   2 1 1  0 0 0 0 0 0 1    2010--SVB, 38 weeks, 16 hour labor, 6+10, [facility]  Past Medical History  Diagnosis Date  . Gall stones     Past Surgical History  Procedure Laterality Date  . Wisdom tooth extraction    . No past surgeries      No family history on file.  History  Substance Use Topics   . Smoking status: Never Smoker   . Smokeless tobacco: Not on file  . Alcohol Use: No    Allergies: No Known Allergies  Prescriptions prior to admission  Medication Sig Dispense Refill  . cyclobenzaprine (FLEXERIL) 10 MG tablet Take 10 mg by mouth daily as needed for muscle spasms.      Marland Kitchen. HYDROcodone-acetaminophen (NORCO) 5-325 MG per tablet Take 1-2 tablets by mouth every 6 (six) hours as needed for moderate pain.  30 tablet  0  . NIFEdipine (PROCARDIA) 10 MG capsule Take 1 capsule (10 mg total) by mouth every 4 (four) hours as needed.  30 capsule  0  . Prenatal Vit-Fe Fumarate-FA (PRENATAL MULTIVITAMIN) TABS tablet Take 1 tablet by mouth daily at 12 noon.        ROS:  Cramping, pressure, +FM Physical Exam   Last menstrual period 11/02/2012.  Physical Exam Chest clear Heart RRR without murmur Abd gravid, NT Pelvic--cervix closed, long, soft, vtx, no d/c in vault Ext WNL  FHR--patient just getting on monitor after my exam UCs--pending assessment, uterus soft to palpation  ED Course  Twin IUP at 33 weeks Pelvic pain, ? Uterine activity  Plan: UA sent FFN done. Await assessment of uterine activity and FHR pattern. Dr. Kate Sableillard updated--she will pass along information to Dr. Estanislado Pandyivard.   Jeanne BridgemanLATHAM, Jeanne Stark CNM, [place] 06/21/2013 6:40 AM

## 2013-06-21 NOTE — Discharge Instructions (Signed)
Fetal Movement Counts Patient Name: __________________________________________________ Patient Due Date: ____________________ Performing a fetal movement count is highly recommended in high-risk pregnancies, but it is good for every pregnant woman to do. Your caregiver may ask you to start counting fetal movements at 28 weeks of the pregnancy. Fetal movements often increase:  After eating a full meal.  After physical activity.  After eating or drinking something sweet or cold.  At rest. Pay attention to when you feel the baby is most active. This will help you notice a pattern of your baby's sleep and wake cycles and what factors contribute to an increase in fetal movement. It is important to perform a fetal movement count at the same time each day when your baby is normally most active.  HOW TO COUNT FETAL MOVEMENTS 1. Find a quiet and comfortable area to sit or lie down on your left side. Lying on your left side provides the best blood and oxygen circulation to your baby. 2. Write down the day and time on a sheet of paper or in a journal. 3. Start counting kicks, flutters, swishes, rolls, or jabs in a 2 hour period. You should feel at least 10 movements within 2 hours. 4. If you do not feel 10 movements in 2 hours, wait 2 3 hours and count again. Look for a change in the pattern or not enough counts in 2 hours. SEEK MEDICAL CARE IF:  You feel less than 10 counts in 2 hours, tried twice.  There is no movement in over an hour.  The pattern is changing or taking longer each day to reach 10 counts in 2 hours.  You feel the baby is not moving as he or she usually does. Date: ____________ Movements: ____________ Start time: ____________ Finish time: ____________  Date: ____________ Movements: ____________ Start time: ____________ Finish time: ____________ Date: ____________ Movements: ____________ Start time: ____________ Finish time: ____________ Date: ____________ Movements: ____________  Start time: ____________ Finish time: ____________ Date: ____________ Movements: ____________ Start time: ____________ Finish time: ____________ Date: ____________ Movements: ____________ Start time: ____________ Finish time: ____________ Date: ____________ Movements: ____________ Start time: ____________ Finish time: ____________ Date: ____________ Movements: ____________ Start time: ____________ Finish time: ____________  Date: ____________ Movements: ____________ Start time: ____________ Finish time: ____________ Date: ____________ Movements: ____________ Start time: ____________ Finish time: ____________ Date: ____________ Movements: ____________ Start time: ____________ Finish time: ____________ Date: ____________ Movements: ____________ Start time: ____________ Finish time: ____________ Date: ____________ Movements: ____________ Start time: ____________ Finish time: ____________ Date: ____________ Movements: ____________ Start time: ____________ Finish time: ____________ Date: ____________ Movements: ____________ Start time: ____________ Finish time: ____________  Date: ____________ Movements: ____________ Start time: ____________ Finish time: ____________ Date: ____________ Movements: ____________ Start time: ____________ Finish time: ____________ Date: ____________ Movements: ____________ Start time: ____________ Finish time: ____________ Date: ____________ Movements: ____________ Start time: ____________ Finish time: ____________ Date: ____________ Movements: ____________ Start time: ____________ Finish time: ____________ Date: ____________ Movements: ____________ Start time: ____________ Finish time: ____________ Date: ____________ Movements: ____________ Start time: ____________ Finish time: ____________  Date: ____________ Movements: ____________ Start time: ____________ Finish time: ____________ Date: ____________ Movements: ____________ Start time: ____________ Finish time:  ____________ Date: ____________ Movements: ____________ Start time: ____________ Finish time: ____________ Date: ____________ Movements: ____________ Start time: ____________ Finish time: ____________ Date: ____________ Movements: ____________ Start time: ____________ Finish time: ____________ Date: ____________ Movements: ____________ Start time: ____________ Finish time: ____________ Date: ____________ Movements: ____________ Start time: ____________ Finish time: ____________  Date: ____________ Movements: ____________ Start time: ____________ Finish   time: ____________ Date: ____________ Movements: ____________ Start time: ____________ Finish time: ____________ Date: ____________ Movements: ____________ Start time: ____________ Finish time: ____________ Date: ____________ Movements: ____________ Start time: ____________ Finish time: ____________ Date: ____________ Movements: ____________ Start time: ____________ Finish time: ____________ Date: ____________ Movements: ____________ Start time: ____________ Finish time: ____________ Date: ____________ Movements: ____________ Start time: ____________ Finish time: ____________  Date: ____________ Movements: ____________ Start time: ____________ Finish time: ____________ Date: ____________ Movements: ____________ Start time: ____________ Finish time: ____________ Date: ____________ Movements: ____________ Start time: ____________ Finish time: ____________ Date: ____________ Movements: ____________ Start time: ____________ Finish time: ____________ Date: ____________ Movements: ____________ Start time: ____________ Finish time: ____________ Date: ____________ Movements: ____________ Start time: ____________ Finish time: ____________ Date: ____________ Movements: ____________ Start time: ____________ Finish time: ____________  Date: ____________ Movements: ____________ Start time: ____________ Finish time: ____________ Date: ____________ Movements:  ____________ Start time: ____________ Finish time: ____________ Date: ____________ Movements: ____________ Start time: ____________ Finish time: ____________ Date: ____________ Movements: ____________ Start time: ____________ Finish time: ____________ Date: ____________ Movements: ____________ Start time: ____________ Finish time: ____________ Date: ____________ Movements: ____________ Start time: ____________ Finish time: ____________ Date: ____________ Movements: ____________ Start time: ____________ Finish time: ____________  Date: ____________ Movements: ____________ Start time: ____________ Finish time: ____________ Date: ____________ Movements: ____________ Start time: ____________ Finish time: ____________ Date: ____________ Movements: ____________ Start time: ____________ Finish time: ____________ Date: ____________ Movements: ____________ Start time: ____________ Finish time: ____________ Date: ____________ Movements: ____________ Start time: ____________ Finish time: ____________ Date: ____________ Movements: ____________ Start time: ____________ Finish time: ____________ Document Released: 05/22/2006 Document Revised: 04/08/2012 Document Reviewed: 02/17/2012 ExitCare Patient Information 2014 [employer].  

## 2013-07-07 ENCOUNTER — Encounter: Payer: Self-pay | Admitting: *Deleted

## 2013-07-14 LAB — OB RESULTS CONSOLE GBS: STREP GROUP B AG: NEGATIVE

## 2013-07-21 ENCOUNTER — Observation Stay (HOSPITAL_COMMUNITY)
Admission: AD | Admit: 2013-07-21 | Discharge: 2013-07-22 | Disposition: A | Discharge status: Home / Self Care | Payer: Medicaid Other | Source: Ambulatory Visit | Attending: Obstetrics and Gynecology | Admitting: Obstetrics and Gynecology

## 2013-07-21 ENCOUNTER — Telehealth (HOSPITAL_COMMUNITY): Payer: Self-pay | Admitting: *Deleted

## 2013-07-21 ENCOUNTER — Inpatient Hospital Stay (HOSPITAL_COMMUNITY): Payer: Medicaid Other

## 2013-07-21 ENCOUNTER — Encounter (HOSPITAL_COMMUNITY): Payer: Self-pay

## 2013-07-21 ENCOUNTER — Encounter (HOSPITAL_COMMUNITY): Payer: Self-pay | Admitting: *Deleted

## 2013-07-21 DIAGNOSIS — O30049 Twin pregnancy, dichorionic/diamniotic, unspecified trimester: Secondary | ICD-10-CM | POA: Insufficient documentation

## 2013-07-21 DIAGNOSIS — A6000 Herpesviral infection of urogenital system, unspecified: Secondary | ICD-10-CM | POA: Insufficient documentation

## 2013-07-21 DIAGNOSIS — O98519 Other viral diseases complicating pregnancy, unspecified trimester: Secondary | ICD-10-CM | POA: Insufficient documentation

## 2013-07-21 DIAGNOSIS — O30009 Twin pregnancy, unspecified number of placenta and unspecified number of amniotic sacs, unspecified trimester: Secondary | ICD-10-CM | POA: Insufficient documentation

## 2013-07-21 DIAGNOSIS — O479 False labor, unspecified: Principal | ICD-10-CM | POA: Insufficient documentation

## 2013-07-21 LAB — CBC
HCT: 29.8 % — ABNORMAL LOW (ref 36.0–46.0)
Hemoglobin: 9.6 g/dL — ABNORMAL LOW (ref 12.0–15.0)
MCH: 23.9 pg — ABNORMAL LOW (ref 26.0–34.0)
MCHC: 32.2 g/dL (ref 30.0–36.0)
MCV: 74.1 fL — ABNORMAL LOW (ref 78.0–100.0)
PLATELETS: 198 10*3/uL (ref 150–400)
RBC: 4.02 MIL/uL (ref 3.87–5.11)
RDW: 15.9 % — AB (ref 11.5–15.5)
WBC: 9.4 10*3/uL (ref 4.0–10.5)

## 2013-07-21 MED ORDER — LACTATED RINGERS IV SOLN
INTRAVENOUS | Status: DC
Start: 2013-07-21 — End: 2013-07-22

## 2013-07-21 MED ORDER — LACTATED RINGERS IV SOLN: 500.0000 mL | INTRAVENOUS | Status: DC | PRN

## 2013-07-21 MED ORDER — CITRIC ACID-SODIUM CITRATE 334-500 MG/5ML PO SOLN: 30.0000 mL | ORAL | Status: DC | PRN

## 2013-07-21 MED ORDER — LIDOCAINE HCL (PF) 1 % IJ SOLN: 30.0000 mL | INTRAMUSCULAR | Status: DC | PRN

## 2013-07-21 MED ORDER — ONDANSETRON HCL 4 MG/2ML IJ SOLN: 4.0000 mg | Freq: Four times a day (QID) | INTRAMUSCULAR | Status: DC | PRN

## 2013-07-21 MED ORDER — SODIUM CHLORIDE 0.9 % IV SOLN: 250.0000 mL | INTRAVENOUS | Status: DC | PRN

## 2013-07-21 MED ORDER — OXYCODONE-ACETAMINOPHEN 5-325 MG PO TABS: 1.0000 | ORAL_TABLET | ORAL | Status: DC | PRN

## 2013-07-21 MED ORDER — ACETAMINOPHEN 325 MG PO TABS: 650.0000 mg | ORAL_TABLET | ORAL | Status: DC | PRN

## 2013-07-21 MED ORDER — OXYTOCIN BOLUS FROM INFUSION: 500.0000 mL | INTRAVENOUS | Status: DC

## 2013-07-21 MED ORDER — SODIUM CHLORIDE 0.9 % IJ SOLN: 3.0000 mL | INTRAMUSCULAR | Status: DC | PRN

## 2013-07-21 MED ORDER — SODIUM CHLORIDE 0.9 % IJ SOLN: 3.0000 mL | Freq: Two times a day (BID) | INTRAMUSCULAR | Status: DC

## 2013-07-21 MED ORDER — IBUPROFEN 600 MG PO TABS: 600.0000 mg | ORAL_TABLET | Freq: Four times a day (QID) | ORAL | Status: DC | PRN

## 2013-07-21 MED ORDER — OXYTOCIN 40 UNITS IN LACTATED RINGERS INFUSION - SIMPLE MED: 62.5000 mL/h | INTRAVENOUS | Status: DC

## 2013-07-21 NOTE — Progress Notes (Signed)
Notified of pt unchanged cervical exam. Gave pt the option of staying another hour and rechecking or go home. Pt decided to stay. Will call J. Oxley after next cervical exam.

## 2013-07-21 NOTE — Progress Notes (Signed)
Notified that pt is back from ultrasound and preliminary results. Will come see pt

## 2013-07-21 NOTE — Progress Notes (Signed)
Notified of pt request for CNM to check cervix. CNM requests I recheck her. Call back if made change. Will come see pt either way

## 2013-07-21 NOTE — H&P (Signed)
Jeanne Stark is a 23 y.o. female, G2P1001 at 2952w2d, presenting for UCs that have become more intense and more frequent.  Cervical change noted while in MAU.  U/S showed - baby A: Right lower fetus, cephalic and baby B: Upper left fetus, transverse head to maternal right.  Patient Active Problem List   Diagnosis Date Noted  . 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 Back pain complicating pregnancy 06/21/2013  . Twins 06/21/2013  . Gallstones 06/21/2013  . 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 Threatened preterm labor 05/27/2013    History of present pregnancy: Patient entered care at 13 weeks.   EDC of 08/09/13 was established by LMP.   Anatomy scan:  18 weeks, with normal findings. Twin A - posterior placenta, Twin B - posterior placenta.   Additional US evaluations:  6646w2d - Di/Di twin gestation, twin A vertex post placenta, normal fluid, EFW 24th%ile, twin B transverse lie, posterior placenta, normal fluid, EFW 41st%ile.   Significant prenatal events:  none   Last evaluation:  07/20/13 at 987w1d   1cm / 30% / -3  OB History   Grav Para Term Preterm Abortions TAB SAB Ect Mult Living   2 1 1  0 0 0 0 0 0 1     Past Medical History  Diagnosis Date  . Gall stones   . Herpes   . Hx of carpal tunnel syndrome   . Preterm labor    Past Surgical History  Procedure Laterality Date  . Wisdom tooth extraction    . No past surgeries     Family History: family history includes COPD in her maternal aunt and maternal grandmother; Hyperlipidemia in her mother. Social History:  reports that she has never smoked. She has never used smokeless tobacco. She reports that she does not drink alcohol or use illicit drugs.   Prenatal Transfer Tool  Maternal Diabetes: No Genetic Screening: Normal Maternal Ultrasounds/Referrals: Normal Fetal Ultrasounds or other Referrals:  None Maternal Substance  Abuse:  No Significant Maternal Medications:  Meds include: Other: Valtrex started at 34 weeks Significant Maternal Lab Results: Lab values include: Group B Strep negative    ROS: see HPI above, all other systems are negative   No Known Allergies   Dilation: 3 Effacement (%): 70 Station: -2 Exam by:: J. Jena Tegeler CNM Blood pressure 130/82, pulse 100, temperature 97.7 F (36.5 C), temperature source Oral, resp. rate 18, last menstrual period 11/02/2012.  Chest clear Heart RRR without murmur Abd gravid, NT Ext: WNL Pelvic exam: normal external genitalia, vulva, vagina, cervix, uterus and adnexa.  No lesions noted on exam.  FHR: Reactive x2 UCs:  Irregular Q 3-8 min  Prenatal labs: ABO, Rh: --/--/O POS, O POS (01/23 1506) Antibody: NEG (01/23 1506) Rubella:   Immune RPR: Nonreactive (09/09 0000)  HBsAg: Negative (09/09 0000)  HIV:   Neg GBS:  Neg Sickle cell/Hgb electrophoresis:  normal Pap:  08/31/12 WNL GC:  Neg Chlamydia:  Neg Genetic screenings:  AFP neg Glucola:  91 Other:  none    Assessment/Plan: IUP at 3752w2d Di/Di twin Labor GBS neg HSV pos - on Valtrex since 34 weeks  Admit to BS per c/w Dr. Dion BodyVarnado Routine BS orders IV pain medication and/or epidural prn     Rowan BlaseXLEY, JENNIFERCNM, MSN 07/21/2013, 10:34 PM

## 2013-07-21 NOTE — Progress Notes (Signed)
Notified of pt arrival in MAU and received orders to check pt and call back

## 2013-07-21 NOTE — MAU Note (Signed)
Report called to birthing suites. Will go to room 172

## 2013-07-21 NOTE — MAU Provider Note (Signed)
History   23yo, G2P1 at 2840w2d presents to MAU with c/o UCs and cramping for the last 2 days but is worse today.  Denies VB, LOF, recent fever, resp or GI c/o's, UTI or PIH s/s. GFM.   Chief Complaint  Patient presents with  . Labor Eval   HPI  OB History   Grav Para Term Preterm Abortions TAB SAB Ect Mult Living   2 1 1  0 0 0 0 0 0 1      Past Medical History  Diagnosis Date  . Gall stones   . Herpes   . Hx of carpal tunnel syndrome   . Preterm labor     Past Surgical History  Procedure Laterality Date  . Wisdom tooth extraction    . No past surgeries      Family History  Problem Relation Age of Onset  . Hyperlipidemia Mother   . COPD Maternal Aunt     breast  . COPD Maternal Grandmother     breast    History  Substance Use Topics  . Smoking status: Never Smoker   . Smokeless tobacco: Never Used  . Alcohol Use: No    Allergies: No Known Allergies  Prescriptions prior to admission  Medication Sig Dispense Refill  . valACYclovir (VALTREX) 1000 MG tablet Take 1,000 mg by mouth daily.        ROS ROS: see HPI above, all other systems are negative  Physical Exam   Blood pressure 130/82, pulse 100, temperature 97.7 F (36.5 C), temperature source Oral, resp. rate 18, last menstrual period 11/02/2012.  Physical Exam Chest: Clear Heart: RRR Abdomen: gravid, NT Extremities: WNL  Pelvic: 1730 - 1 / 50 / -3 1830 - 1 / 50 / -3 1730 - 2.5 / 60 / -3 Above exams by RN 2015 - 2 / 60 / -3   FHT: Reactive x2 UCs: Irregular  ED Course  IUP at 6340w2d Di/Di twin gestation Labor check  U/S for presentation  Some cervical change Admit to BS Dr. Dion BodyVarnado notifed    Jeanne Stark, Jeanne Stark CNM, MSN 07/21/2013 7:25 PM

## 2013-07-21 NOTE — Progress Notes (Signed)
Message left regarding the pt cervical change.

## 2013-07-21 NOTE — MAU Note (Signed)
Pt presents complaining of cramping and contractions that started two days ago and worsened today. Denies vaginal bleeding and discharge. Reports good fetal movement.

## 2013-07-21 NOTE — Telephone Encounter (Signed)
Preadmission screen  

## 2013-07-21 NOTE — Progress Notes (Signed)
Notified of cervical exam. Will let pt walk for 1 hour and recheck

## 2013-07-22 DIAGNOSIS — O479 False labor, unspecified: Secondary | ICD-10-CM | POA: Diagnosis present

## 2013-07-22 LAB — TYPE AND SCREEN
ABO/RH(D): O POS
Antibody Screen: NEGATIVE

## 2013-07-22 LAB — RPR: RPR Ser Ql: NONREACTIVE

## 2013-07-22 NOTE — Progress Notes (Signed)
In to assess pt.  Pt is hungry and desires to eat.  Awaiting f/u cervical exam by CNM who is busy with a delivery. Pt states pain with contractions is intermittent.  Not currently feeling pressure. Denies LOF or VB. 133/71 Cat 1 tracing x 2.   Contractions irregular, 2-6 min.  Palpate firm. Moderate discomfort with contractions. Cervix 2.5/70/-3, Soft posterior. A/P Twin IUP at 37 3/7 weeks Not in active labor.  No cervical change in 5.5 hours. Recommend discharge. Will discuss with CNM.

## 2013-07-22 NOTE — Discharge Instructions (Signed)

## 2013-07-22 NOTE — Progress Notes (Signed)
Updated Oxley CNM on fetal heart rate, uterine activity

## 2013-07-22 NOTE — Discharge Summary (Signed)
Physician Discharge Summary  Patient ID: Jeanne Stark MRN: [id number] DOB/AGE: [birth year] 23 y.o.  Admit date: 07/21/2013 Discharge date: 07/22/2013  Admission Diagnoses: Labor, Di/Di twin gestation  Discharge Diagnoses: False labor  Discharged Condition: stable  Hospital Course: Pt was admitted in early labor with cervical change and observed overnight.  No further cervical change.  Consults: None  Significant Diagnostic Studies: none  Treatments: none  Discharge Exam: Blood pressure 106/46, pulse 88, temperature 97.9 F (36.6 C), temperature source Oral, resp. rate 18, height 5\' 2"  (1.575 m), weight 69.854 kg (154 lb), last menstrual period 11/02/2012. General appearance: alert, cooperative and no distress  Disposition: 01-Home or Self Care     Medication List    ASK your doctor about these medications       valACYclovir 1000 MG tablet  Commonly known as:  VALTREX  Take 1,000 mg by mouth daily.           Follow-up Information   [facility] On 07/22/2013. (Keep all scheduled appointments. Call with any questions or concerns.)    Specialty:  Obstetrics and Gynecology   Contact information:   [address] [place] [id number] [phone]    IOL scheduled for 3/23  Signed: Haroldine LawsOXLEY, Leiani Enright 07/22/2013, 7:29 AM

## 2013-07-22 NOTE — Progress Notes (Signed)
Oxley, CNM said okay to saline lock patient.

## 2013-07-24 ENCOUNTER — Inpatient Hospital Stay (HOSPITAL_COMMUNITY)
Admission: AD | Admit: 2013-07-24 | Discharge: 2013-07-27 | DRG: 765 | Disposition: A | Discharge status: Home / Self Care | Payer: Medicaid Other | Source: Ambulatory Visit | Attending: Obstetrics and Gynecology | Admitting: Obstetrics and Gynecology

## 2013-07-24 ENCOUNTER — Encounter (HOSPITAL_COMMUNITY): Payer: Self-pay | Admitting: *Deleted

## 2013-07-24 ENCOUNTER — Encounter (HOSPITAL_COMMUNITY): Payer: Medicaid Other | Admitting: Anesthesiology

## 2013-07-24 ENCOUNTER — Encounter (HOSPITAL_COMMUNITY)
Admission: AD | Disposition: A | Discharge status: Home / Self Care | Payer: Self-pay | Source: Ambulatory Visit | Attending: Obstetrics and Gynecology

## 2013-07-24 ENCOUNTER — Inpatient Hospital Stay (HOSPITAL_COMMUNITY): Payer: Medicaid Other

## 2013-07-24 ENCOUNTER — Inpatient Hospital Stay (HOSPITAL_COMMUNITY): Payer: Medicaid Other | Admitting: Anesthesiology

## 2013-07-24 DIAGNOSIS — D649 Anemia, unspecified: Secondary | ICD-10-CM | POA: Diagnosis present

## 2013-07-24 DIAGNOSIS — O30049 Twin pregnancy, dichorionic/diamniotic, unspecified trimester: Secondary | ICD-10-CM

## 2013-07-24 DIAGNOSIS — O98519 Other viral diseases complicating pregnancy, unspecified trimester: Secondary | ICD-10-CM | POA: Diagnosis present

## 2013-07-24 DIAGNOSIS — O9902 Anemia complicating childbirth: Secondary | ICD-10-CM | POA: Diagnosis present

## 2013-07-24 DIAGNOSIS — O30009 Twin pregnancy, unspecified number of placenta and unspecified number of amniotic sacs, unspecified trimester: Secondary | ICD-10-CM | POA: Diagnosis not present

## 2013-07-24 DIAGNOSIS — O323XX0 Maternal care for face, brow and chin presentation, not applicable or unspecified: Secondary | ICD-10-CM | POA: Diagnosis present

## 2013-07-24 DIAGNOSIS — A6000 Herpesviral infection of urogenital system, unspecified: Secondary | ICD-10-CM | POA: Diagnosis present

## 2013-07-24 LAB — COMPREHENSIVE METABOLIC PANEL
ALK PHOS: 185 U/L — AB (ref 39–117)
ALT: 8 U/L (ref 0–35)
AST: 33 U/L (ref 0–37)
Albumin: 1.8 g/dL — ABNORMAL LOW (ref 3.5–5.2)
BUN: 5 mg/dL — AB (ref 6–23)
CHLORIDE: 101 meq/L (ref 96–112)
CO2: 21 meq/L (ref 19–32)
Calcium: 8.3 mg/dL — ABNORMAL LOW (ref 8.4–10.5)
Creatinine, Ser: 0.63 mg/dL (ref 0.50–1.10)
GLUCOSE: 105 mg/dL — AB (ref 70–99)
POTASSIUM: 4.2 meq/L (ref 3.7–5.3)
SODIUM: 136 meq/L — AB (ref 137–147)
Total Bilirubin: 0.2 mg/dL — ABNORMAL LOW (ref 0.3–1.2)
Total Protein: 5.3 g/dL — ABNORMAL LOW (ref 6.0–8.3)

## 2013-07-24 LAB — PROTEIN / CREATININE RATIO, URINE
Creatinine, Urine: 22.16 mg/dL
Protein Creatinine Ratio: 0.25 — ABNORMAL HIGH (ref 0.00–0.15)
TOTAL PROTEIN, URINE: 5.5 mg/dL

## 2013-07-24 LAB — CBC
HEMATOCRIT: 29.9 % — AB (ref 36.0–46.0)
HEMATOCRIT: 27.5 % — AB (ref 36.0–46.0)
HEMOGLOBIN: 9.5 g/dL — AB (ref 12.0–15.0)
Hemoglobin: 8.8 g/dL — ABNORMAL LOW (ref 12.0–15.0)
MCH: 23.6 pg — AB (ref 26.0–34.0)
MCH: 23.7 pg — ABNORMAL LOW (ref 26.0–34.0)
MCHC: 31.8 g/dL (ref 30.0–36.0)
MCHC: 32 g/dL (ref 30.0–36.0)
MCV: 74.4 fL — AB (ref 78.0–100.0)
MCV: 74.1 fL — ABNORMAL LOW (ref 78.0–100.0)
Platelets: 173 10*3/uL (ref 150–400)
Platelets: 163 10*3/uL (ref 150–400)
RBC: 4.02 MIL/uL (ref 3.87–5.11)
RBC: 3.71 MIL/uL — AB (ref 3.87–5.11)
RDW: 16 % — ABNORMAL HIGH (ref 11.5–15.5)
RDW: 15.9 % — ABNORMAL HIGH (ref 11.5–15.5)
WBC: 9.2 10*3/uL (ref 4.0–10.5)
WBC: 16.5 10*3/uL — ABNORMAL HIGH (ref 4.0–10.5)

## 2013-07-24 LAB — LACTATE DEHYDROGENASE: LDH: 379 U/L — AB (ref 94–250)

## 2013-07-24 LAB — RPR: RPR Ser Ql: NONREACTIVE

## 2013-07-24 LAB — URIC ACID: Uric Acid, Serum: 7.2 mg/dL — ABNORMAL HIGH (ref 2.4–7.0)

## 2013-07-24 LAB — TYPE AND SCREEN
ABO/RH(D): O POS
Antibody Screen: NEGATIVE

## 2013-07-24 SURGERY — Surgical Case
Anesthesia: General | Site: Abdomen

## 2013-07-24 MED ORDER — CEFAZOLIN SODIUM-DEXTROSE 2-3 GM-% IV SOLR
INTRAVENOUS | Status: DC | PRN
  Administered 2013-07-24: 2 g via INTRAVENOUS

## 2013-07-24 MED ORDER — EPHEDRINE 5 MG/ML INJ
10.0000 mg | INTRAVENOUS | Status: DC | PRN
  Filled 2013-07-24: qty 2

## 2013-07-24 MED ORDER — ZOLPIDEM TARTRATE 5 MG PO TABS: 5.0000 mg | ORAL_TABLET | Freq: Every evening | ORAL | Status: DC | PRN

## 2013-07-24 MED ORDER — LANOLIN HYDROUS EX OINT: 1.0000 "application " | TOPICAL_OINTMENT | CUTANEOUS | Status: DC | PRN

## 2013-07-24 MED ORDER — MEASLES, MUMPS & RUBELLA VAC ~~LOC~~ INJ: 0.5000 mL | INJECTION | Freq: Once | SUBCUTANEOUS | Status: DC

## 2013-07-24 MED ORDER — SIMETHICONE 80 MG PO CHEW
80.0000 mg | CHEWABLE_TABLET | ORAL | Status: DC
  Administered 2013-07-26 (×2): 80 mg via ORAL
  Filled 2013-07-24 (×3): qty 1

## 2013-07-24 MED ORDER — ONDANSETRON HCL 4 MG/2ML IJ SOLN: 4.0000 mg | INTRAMUSCULAR | Status: DC | PRN

## 2013-07-24 MED ORDER — LACTATED RINGERS IV SOLN
INTRAVENOUS | Status: DC
  Administered 2013-07-24: 21:00:00 via INTRAVENOUS

## 2013-07-24 MED ORDER — PHENYLEPHRINE 40 MCG/ML (10ML) SYRINGE FOR IV PUSH (FOR BLOOD PRESSURE SUPPORT)
80.0000 ug | PREFILLED_SYRINGE | INTRAVENOUS | Status: DC | PRN
  Filled 2013-07-24: qty 2

## 2013-07-24 MED ORDER — MENTHOL 3 MG MT LOZG
1.0000 | LOZENGE | OROMUCOSAL | Status: DC | PRN
Start: 2013-07-24 — End: 2013-07-27

## 2013-07-24 MED ORDER — PROPOFOL 10 MG/ML IV BOLUS
INTRAVENOUS | Status: DC | PRN
  Administered 2013-07-24: 200 mg via INTRAVENOUS

## 2013-07-24 MED ORDER — MISOPROSTOL 200 MCG PO TABS: 800.0000 ug | ORAL_TABLET | Freq: Once | ORAL | Status: AC | PRN

## 2013-07-24 MED ORDER — FENTANYL CITRATE 0.05 MG/ML IJ SOLN
INTRAMUSCULAR | Status: AC
  Filled 2013-07-24: qty 2

## 2013-07-24 MED ORDER — FENTANYL 2.5 MCG/ML BUPIVACAINE 1/10 % EPIDURAL INFUSION (WH - ANES): 14.0000 mL/h | INTRAMUSCULAR | Status: DC | PRN

## 2013-07-24 MED ORDER — ONDANSETRON HCL 4 MG/2ML IJ SOLN
INTRAMUSCULAR | Status: DC | PRN
  Administered 2013-07-24: 4 mg via INTRAVENOUS

## 2013-07-24 MED ORDER — FERROUS SULFATE 325 (65 FE) MG PO TABS
325.0000 mg | ORAL_TABLET | Freq: Two times a day (BID) | ORAL | Status: DC
  Administered 2013-07-25 – 2013-07-27 (×4): 325 mg via ORAL
  Filled 2013-07-24 (×5): qty 1

## 2013-07-24 MED ORDER — CLINDAMYCIN PHOSPHATE 900 MG/50ML IV SOLN
900.0000 mg | Freq: Once | INTRAVENOUS | Status: DC
  Filled 2013-07-24: qty 50

## 2013-07-24 MED ORDER — SENNOSIDES-DOCUSATE SODIUM 8.6-50 MG PO TABS
2.0000 | ORAL_TABLET | ORAL | Status: DC
  Administered 2013-07-24 – 2013-07-26 (×3): 2 via ORAL
  Filled 2013-07-24 (×3): qty 2

## 2013-07-24 MED ORDER — IBUPROFEN 600 MG PO TABS: 600.0000 mg | ORAL_TABLET | Freq: Four times a day (QID) | ORAL | Status: DC | PRN

## 2013-07-24 MED ORDER — ONDANSETRON HCL 4 MG PO TABS: 4.0000 mg | ORAL_TABLET | ORAL | Status: DC | PRN

## 2013-07-24 MED ORDER — LACTATED RINGERS IV SOLN
INTRAVENOUS | Status: DC
  Administered 2013-07-24: 10:00:00 via INTRAVENOUS

## 2013-07-24 MED ORDER — IBUPROFEN 600 MG PO TABS
600.0000 mg | ORAL_TABLET | Freq: Four times a day (QID) | ORAL | Status: DC
  Administered 2013-07-24 – 2013-07-27 (×11): 600 mg via ORAL
  Filled 2013-07-24 (×11): qty 1

## 2013-07-24 MED ORDER — OXYTOCIN 40 UNITS IN LACTATED RINGERS INFUSION - SIMPLE MED
62.5000 mL/h | INTRAVENOUS | Status: AC
Start: 2013-07-24 — End: 2013-07-25

## 2013-07-24 MED ORDER — GENTAMICIN SULFATE 40 MG/ML IJ SOLN
Freq: Once | INTRAVENOUS | Status: AC
  Administered 2013-07-24: 18:00:00 via INTRAVENOUS
  Filled 2013-07-24: qty 3.75

## 2013-07-24 MED ORDER — DIBUCAINE 1 % RE OINT: 1.0000 "application " | TOPICAL_OINTMENT | RECTAL | Status: DC | PRN

## 2013-07-24 MED ORDER — LIDOCAINE HCL (PF) 1 % IJ SOLN
30.0000 mL | INTRAMUSCULAR | Status: DC | PRN
  Filled 2013-07-24: qty 30

## 2013-07-24 MED ORDER — WITCH HAZEL-GLYCERIN EX PADS: 1.0000 "application " | MEDICATED_PAD | CUTANEOUS | Status: DC | PRN

## 2013-07-24 MED ORDER — TETANUS-DIPHTH-ACELL PERTUSSIS 5-2.5-18.5 LF-MCG/0.5 IM SUSP: 0.5000 mL | Freq: Once | INTRAMUSCULAR | Status: DC

## 2013-07-24 MED ORDER — SIMETHICONE 80 MG PO CHEW
80.0000 mg | CHEWABLE_TABLET | Freq: Three times a day (TID) | ORAL | Status: DC
  Administered 2013-07-25 – 2013-07-27 (×7): 80 mg via ORAL
  Filled 2013-07-24 (×7): qty 1

## 2013-07-24 MED ORDER — KETOROLAC TROMETHAMINE 30 MG/ML IJ SOLN
30.0000 mg | Freq: Four times a day (QID) | INTRAMUSCULAR | Status: DC | PRN
  Administered 2013-07-24: 30 mg via INTRAVENOUS
  Filled 2013-07-24: qty 1

## 2013-07-24 MED ORDER — ONDANSETRON HCL 4 MG/2ML IJ SOLN: 4.0000 mg | Freq: Four times a day (QID) | INTRAMUSCULAR | Status: DC | PRN

## 2013-07-24 MED ORDER — SUCCINYLCHOLINE CHLORIDE 20 MG/ML IJ SOLN
INTRAMUSCULAR | Status: DC | PRN
  Administered 2013-07-24: 140 mg via INTRAVENOUS

## 2013-07-24 MED ORDER — ACETAMINOPHEN 325 MG PO TABS: 650.0000 mg | ORAL_TABLET | ORAL | Status: DC | PRN

## 2013-07-24 MED ORDER — MORPHINE SULFATE 4 MG/ML IJ SOLN
1.0000 mg | INTRAMUSCULAR | Status: DC | PRN
  Administered 2013-07-24 (×4): 2 mg via INTRAVENOUS

## 2013-07-24 MED ORDER — MORPHINE SULFATE 4 MG/ML IJ SOLN
INTRAMUSCULAR | Status: AC
  Administered 2013-07-24: 2 mg via INTRAVENOUS
  Filled 2013-07-24: qty 1

## 2013-07-24 MED ORDER — METOCLOPRAMIDE HCL 5 MG/ML IJ SOLN: 10.0000 mg | Freq: Once | INTRAMUSCULAR | Status: DC | PRN

## 2013-07-24 MED ORDER — FENTANYL CITRATE 0.05 MG/ML IJ SOLN
50.0000 ug | Freq: Once | INTRAMUSCULAR | Status: AC
Start: 2013-07-24 — End: 2013-07-24
  Administered 2013-07-24: 50 ug via INTRAVENOUS
  Filled 2013-07-24: qty 2

## 2013-07-24 MED ORDER — LACTATED RINGERS IV SOLN: 500.0000 mL | Freq: Once | INTRAVENOUS | Status: DC

## 2013-07-24 MED ORDER — FENTANYL CITRATE 0.05 MG/ML IJ SOLN
INTRAMUSCULAR | Status: DC | PRN
  Administered 2013-07-24 (×4): 50 ug via INTRAVENOUS

## 2013-07-24 MED ORDER — OXYTOCIN 40 UNITS IN LACTATED RINGERS INFUSION - SIMPLE MED
62.5000 mL/h | INTRAVENOUS | Status: DC
  Filled 2013-07-24: qty 1000

## 2013-07-24 MED ORDER — OXYTOCIN 10 UNIT/ML IJ SOLN
40.0000 [IU] | INTRAVENOUS | Status: DC | PRN
  Administered 2013-07-24: 40 [IU] via INTRAVENOUS

## 2013-07-24 MED ORDER — DIPHENHYDRAMINE HCL 25 MG PO CAPS: 25.0000 mg | ORAL_CAPSULE | Freq: Four times a day (QID) | ORAL | Status: DC | PRN

## 2013-07-24 MED ORDER — HYDROMORPHONE HCL PF 1 MG/ML IJ SOLN
INTRAMUSCULAR | Status: DC | PRN
  Administered 2013-07-24: 1 mg via INTRAVENOUS

## 2013-07-24 MED ORDER — HYDROMORPHONE HCL PF 1 MG/ML IJ SOLN
INTRAMUSCULAR | Status: AC
  Filled 2013-07-24: qty 1

## 2013-07-24 MED ORDER — DIPHENHYDRAMINE HCL 50 MG/ML IJ SOLN: 12.5000 mg | INTRAMUSCULAR | Status: DC | PRN

## 2013-07-24 MED ORDER — LACTATED RINGERS IV SOLN
INTRAVENOUS | Status: DC | PRN
  Administered 2013-07-24: 10:00:00 via INTRAVENOUS

## 2013-07-24 MED ORDER — OXYCODONE-ACETAMINOPHEN 5-325 MG PO TABS: 1.0000 | ORAL_TABLET | ORAL | Status: DC | PRN

## 2013-07-24 MED ORDER — SIMETHICONE 80 MG PO CHEW
80.0000 mg | CHEWABLE_TABLET | ORAL | Status: DC | PRN
  Administered 2013-07-24: 80 mg via ORAL

## 2013-07-24 MED ORDER — FENTANYL CITRATE 0.05 MG/ML IJ SOLN
100.0000 ug | INTRAMUSCULAR | Status: DC | PRN
  Administered 2013-07-24 (×2): 100 ug via INTRAVENOUS
  Filled 2013-07-24 (×2): qty 2

## 2013-07-24 MED ORDER — OXYCODONE-ACETAMINOPHEN 5-325 MG PO TABS
1.0000 | ORAL_TABLET | ORAL | Status: DC | PRN
  Administered 2013-07-24 – 2013-07-25 (×2): 1 via ORAL
  Administered 2013-07-25 – 2013-07-26 (×7): 2 via ORAL
  Administered 2013-07-27 (×3): 1 via ORAL
  Filled 2013-07-24: qty 1
  Filled 2013-07-24 (×4): qty 2
  Filled 2013-07-24: qty 1
  Filled 2013-07-24 (×2): qty 2
  Filled 2013-07-24: qty 1
  Filled 2013-07-24: qty 2
  Filled 2013-07-24 (×2): qty 1

## 2013-07-24 MED ORDER — PRENATAL MULTIVITAMIN CH
1.0000 | ORAL_TABLET | Freq: Every day | ORAL | Status: DC
  Administered 2013-07-25 – 2013-07-26 (×2): 1 via ORAL
  Filled 2013-07-24 (×2): qty 1

## 2013-07-24 MED ORDER — CITRIC ACID-SODIUM CITRATE 334-500 MG/5ML PO SOLN: 30.0000 mL | ORAL | Status: DC | PRN

## 2013-07-24 MED ORDER — LACTATED RINGERS IV SOLN
500.0000 mL | INTRAVENOUS | Status: DC | PRN
Start: 2013-07-24 — End: 2013-07-27

## 2013-07-24 MED ORDER — OXYTOCIN BOLUS FROM INFUSION: 500.0000 mL | INTRAVENOUS | Status: DC

## 2013-07-24 SURGICAL SUPPLY — 52 items
ADH SKN CLS APL DERMABOND .7 (GAUZE/BANDAGES/DRESSINGS)
APL SKNCLS STERI-STRIP NONHPOA (GAUZE/BANDAGES/DRESSINGS) ×1
BARRIER ADHS 3X4 INTERCEED (GAUZE/BANDAGES/DRESSINGS) ×3 IMPLANT
BENZOIN TINCTURE PRP APPL 2/3 (GAUZE/BANDAGES/DRESSINGS) ×2 IMPLANT
BRR ADH 4X3 ABS CNTRL BYND (GAUZE/BANDAGES/DRESSINGS) ×1
CLAMP CORD UMBIL (MISCELLANEOUS) ×6 IMPLANT
CLOSURE WOUND 1/2 X4 (GAUZE/BANDAGES/DRESSINGS) ×1
CLOSURE WOUND 1/4X4 (GAUZE/BANDAGES/DRESSINGS) ×1
CLOTH BEACON ORANGE TIMEOUT ST (SAFETY) ×3 IMPLANT
DERMABOND ADVANCED (GAUZE/BANDAGES/DRESSINGS)
DERMABOND ADVANCED .7 DNX12 (GAUZE/BANDAGES/DRESSINGS) IMPLANT
DRAPE LG THREE QUARTER DISP (DRAPES) ×2 IMPLANT
DRSG OPSITE POSTOP 4X10 (GAUZE/BANDAGES/DRESSINGS) ×3 IMPLANT
DURAPREP 26ML APPLICATOR (WOUND CARE) ×3 IMPLANT
ELECT REM PT RETURN 9FT ADLT (ELECTROSURGICAL) ×3
ELECTRODE REM PT RTRN 9FT ADLT (ELECTROSURGICAL) ×1 IMPLANT
EXTRACTOR VACUUM BELL STYLE (SUCTIONS) ×2 IMPLANT
GAUZE SPONGE 4X4 16PLY XRAY LF (GAUZE/BANDAGES/DRESSINGS) ×2 IMPLANT
GLOVE BIO SURGEON STRL SZ 6.5 (GLOVE) ×1 IMPLANT
GLOVE BIO SURGEON STRL SZ7 (GLOVE) ×5 IMPLANT
GLOVE BIO SURGEON STRL SZ7.5 (GLOVE) ×2 IMPLANT
GLOVE BIO SURGEONS STRL SZ 6.5 (GLOVE) ×1
GLOVE BIOGEL M 7.0 STRL (GLOVE) ×2 IMPLANT
GLOVE BIOGEL PI IND STRL 6.5 (GLOVE) IMPLANT
GLOVE BIOGEL PI IND STRL 7.0 (GLOVE) ×1 IMPLANT
GLOVE BIOGEL PI IND STRL 7.5 (GLOVE) IMPLANT
GLOVE BIOGEL PI INDICATOR 6.5 (GLOVE) ×2
GLOVE BIOGEL PI INDICATOR 7.0 (GLOVE) ×4
GLOVE BIOGEL PI INDICATOR 7.5 (GLOVE) ×2
GOWN STRL REUS W/TWL LRG LVL3 (GOWN DISPOSABLE) ×6 IMPLANT
KIT ABG SYR 3ML LUER SLIP (SYRINGE) ×2 IMPLANT
NDL HYPO 25X5/8 SAFETYGLIDE (NEEDLE) IMPLANT
NEEDLE HYPO 25X5/8 SAFETYGLIDE (NEEDLE) IMPLANT
NS IRRIG 1000ML POUR BTL (IV SOLUTION) ×3 IMPLANT
PACK C SECTION WH (CUSTOM PROCEDURE TRAY) ×3 IMPLANT
PAD OB MATERNITY 4.3X12.25 (PERSONAL CARE ITEMS) ×3 IMPLANT
RTRCTR C-SECT PINK 25CM LRG (MISCELLANEOUS) ×1 IMPLANT
STRIP CLOSURE SKIN 1/2X4 (GAUZE/BANDAGES/DRESSINGS) ×1 IMPLANT
STRIP CLOSURE SKIN 1/4X4 (GAUZE/BANDAGES/DRESSINGS) ×1 IMPLANT
SUT CHROMIC 0 CTX 36 (SUTURE) ×15 IMPLANT
SUT PLAIN 2 0 (SUTURE) ×6
SUT PLAIN 2 0 XLH (SUTURE) ×3 IMPLANT
SUT PLAIN ABS 2-0 54XMFL TIE (SUTURE) IMPLANT
SUT VIC AB 0 CT1 27 (SUTURE) ×9
SUT VIC AB 0 CT1 27XBRD ANBCTR (SUTURE) ×2 IMPLANT
SUT VIC AB 2-0 CT1 27 (SUTURE) ×3
SUT VIC AB 2-0 CT1 TAPERPNT 27 (SUTURE) ×1 IMPLANT
SUT VIC AB 4-0 KS 27 (SUTURE) ×3 IMPLANT
TAPE CLOTH SURG 4X10 WHT LF (GAUZE/BANDAGES/DRESSINGS) ×2 IMPLANT
TOWEL OR 17X24 6PK STRL BLUE (TOWEL DISPOSABLE) ×3 IMPLANT
TRAY FOLEY CATH 14FR (SET/KITS/TRAYS/PACK) ×2 IMPLANT
WATER STERILE IRR 1000ML POUR (IV SOLUTION) ×3 IMPLANT

## 2013-07-24 NOTE — Progress Notes (Signed)
  Subjective: Pt coping well with UCs. Requested 2nd dose of IV pain medication.  Objective: BP 143/79  Pulse 92  Temp(Src) 97.9 F (36.6 C) (Oral)  Resp 18  Ht 5\' 2"  (1.575 m)  Wt 154 lb (69.854 kg)  BMI 28.16 kg/m2  LMP 11/02/2012      FHT:  Cat I x 2 UC:   regular, every 2-4 minutes  SVE:   Dilation: 5 Effacement (%): 90 Station: -1 Exam by:: J Travonna Swindle CNM  Assessment / Plan:  Spontaneous labor, progressing normally  Labor: Progressing normally  Preeclampsia: no signs or symptoms of toxicity  Fetal Wellbeing: Category I x 2 Pain Control: Fentanyl  I/D: GBS neg; SROM at 0020; Afebrile  Anticipated MOD: NSVD   Siriyah Ambrosius 07/24/2013, 4:39 AM

## 2013-07-24 NOTE — Transfer of Care (Signed)
Immediate Anesthesia Transfer of Care Note  Patient: Jeanne Stark  Procedure(s) Performed: Procedure(s): Emergent CESAREAN SECTION, repair of vaginal laceration (N/A)  Patient Location: PACU  Anesthesia Type:General  Level of Consciousness: awake, alert  and oriented  Airway & Oxygen Therapy: Patient Spontanous Breathing and Patient connected to nasal cannula oxygen  Post-op Assessment: Report given to PACU RN, Post -op Vital signs reviewed and stable and Patient moving all extremities  Post vital signs: Reviewed and stable  Complications: No apparent anesthesia complications

## 2013-07-24 NOTE — Progress Notes (Signed)
Bedside ultrasound shows maternal head to pt's right, back down, feet below head. Cvx 8, more effaced on left side, still edematous on the right half, 2+ station, mild molding. Doing well with Fentanyl.

## 2013-07-24 NOTE — Progress Notes (Signed)
In to assess pt and discuss surgery in that she had general anesthesia. Pt comfortable but having abdominal soreness/cramping. Requests pain medication 6/10 pain.  Denies vaginal pain. VSS. Gen:  NAD 132/63 UA neg for protein. S/p primary LTCS, vaginal laceration repair, SVD Elevated BP prior to delivery likely due to pain. Routine post op care. Toradol 30 mg IV prn pain.

## 2013-07-24 NOTE — Brief Op Note (Signed)
07/24/2013  12:30 PM  PATIENT:  Jeanne Stark  23 y.o. female  PRE-OPERATIVE DIAGNOSIS:  IUP at 937 5/7 weeks, Twin gestation, S/p SVD of Baby A, Non-reassuring fetal heart tones, Asynclitic presentation, Failed vacuum on baby "B", vaginal laceration  POST-OPERATIVE DIAGNOSIS:  Same, Brow presentation  PROCEDURE:  Procedure(s): Emergent CESAREAN SECTION, repair of vaginal laceration (N/A)  SURGEON:  Surgeon(s) and Role:    * Geryl RankinsEvelyn Indra Wolters, MD - Primary  PHYSICIAN ASSISTANT: None  ASSISTANTS: Almond LintShelly Lillard, CNM. , technician  ANESTHESIA:   local and general  EBL:  Total I/O In: 1500 [I.V.:1500] Out: 600 [Urine:100; Blood:500]  BLOOD ADMINISTERED:none  DRAINS: Urinary Catheter (Foley)   LOCAL MEDICATIONS USED:  LIDOCAINE   SPECIMEN:  Source of Specimen:  Placenta x 2, Cord B with 2 clamps  DISPOSITION OF SPECIMEN:  PATHOLOGY  COUNTS:  YES  TOURNIQUET:  * No tourniquets in log *  DICTATION: .Other Dictation: Dictation Number [id number]  PLAN OF CARE: Transfer to Mother-Baby  PATIENT DISPOSITION:  PACU - hemodynamically stable.   Delay start of Pharmacological VTE agent (>24hrs) due to surgical blood loss or risk of bleeding: yes

## 2013-07-24 NOTE — MAU Note (Addendum)
Water broke at [employer], thinks it's clear. No bleeding per pt.  Twins.  They are moving a little bit per pt.  States plans vaginal delivery.

## 2013-07-24 NOTE — Anesthesia Preprocedure Evaluation (Signed)
Anesthesia Evaluation  Patient identified by MRN, date of birth, ID band Patient awake    Reviewed: Allergy & Precautions, H&P , NPO status , Patient's Chart, lab work & pertinent test results  Airway Mallampati: III TM Distance: >3 FB Neck ROM: Full    Dental no notable dental hx. (+) Teeth Intact   Pulmonary neg pulmonary ROS,  breath sounds clear to auscultation  Pulmonary exam normal       Cardiovascular negative cardio ROS  Rhythm:Regular Rate:Normal     Neuro/Psych negative neurological ROS  negative psych ROS   GI/Hepatic GERD-  ,Cholelithiasis   Endo/Other    Renal/GU   negative genitourinary   Musculoskeletal   Abdominal   Peds  Hematology  (+) anemia ,   Anesthesia Other Findings   Reproductive/Obstetrics (+) Pregnancy Twin Gestation 37 weeks Hx/o PTL HSV Twin A Vertex Twin B Transverse                           Anesthesia Physical Anesthesia Plan  ASA: II  Anesthesia Plan: General   Post-op Pain Management:    Induction: Intravenous, Rapid sequence and Cricoid pressure planned  Airway Management Planned: Oral ETT  Additional Equipment:   Intra-op Plan:   Post-operative Plan: Extubation in OR  Informed Consent: I have reviewed the patients History and Physical, chart, labs and discussed the procedure including the risks, benefits and alternatives for the proposed anesthesia with the patient or authorized representative who has indicated his/her understanding and acceptance.   Dental advisory given  Plan Discussed with: CRNA, Anesthesiologist and Surgeon  Anesthesia Plan Comments: (Patient does not want RA. Accepts risks of GA if needed for urgent Cesarean delivery. )        Anesthesia Quick Evaluation

## 2013-07-24 NOTE — Anesthesia Postprocedure Evaluation (Signed)
  Anesthesia Post-op Note  Patient: Jeanne Stark ([place]Benjamin N Galea  Procedure(s) Performed: Procedure(s): Emergent CESAREAN SECTION, repair of vaginal laceration (N/A)  Patient Location: PACU  Anesthesia Type:General  Level of Consciousness: awake, alert  and oriented  Airway and Oxygen Therapy: Patient Spontanous Breathing  Post-op Pain: mild  Post-op Assessment: Post-op Vital signs reviewed, Patient's Cardiovascular Status Stable, Respiratory Function Stable, Patent Airway, No signs of Nausea or vomiting and Pain level controlled  Post-op Vital Signs: Reviewed and stable  Complications: No apparent anesthesia complications

## 2013-07-24 NOTE — Progress Notes (Signed)
ANTIBIOTIC CONSULT NOTE - INITIAL  Pharmacy Consult for Gentamicin Indication: Post-op prophylaxis/ contaminated abd case  No Known Allergies  Patient Measurements: Height: 5\' 2"  (157.5 cm) Weight: 154 lb (69.854 kg) IBW/kg (Calculated) : 50.1 Adjusted Body Weight: 56kg    Vital Signs: Temp: 98.1 F (36.7 C) (03/21 1400) Temp src: Oral (03/21 0702) BP: 132/63 mmHg (03/21 1400) Pulse Rate: 100 (03/21 1400) Intake/Output from previous day:   Intake/Output from this shift: Total I/O In: 1700 [I.V.:1700] Out: 1600 [Urine:1100; Blood:500]  Labs:  Recent Labs  07/21/13 2220 07/24/13 0225 07/24/13 1703 07/24/13 1705  WBC 9.4 9.2 16.5*  --   HGB 9.6* 9.5* 8.8*  --   PLT 198 173 163  --   LABCREA  --   --   --  22.16  CREATININE  --   --  0.63  --    Estimated Creatinine Clearance: 98.4 ml/min (by C-G formula based on Cr of 0.63).   Microbiology: Recent Results (from the past 720 hour(s))  OB RESULTS CONSOLE GBS     Status: None   Collection Time    07/14/13 12:00 AM      Result Value Ref Range Status   GBS Negative   Final    Medical History: Past Medical History  Diagnosis Date  . Gall stones   . Herpes   . Hx of carpal tunnel syndrome   . Preterm labor     Medications:  Ancef 2 gram IV received pre-op. Clindamycin 900mg  IV x 1 ordered post-op. Assessment: 23yo F 37+ weeks w/ twins admitted with SROM. Twin A delivered via vaginal delivery. Twin B delivered via LTCS. One dose of Gentamicin and Clindamycin ordered post-op (verified with Dr. Dion BodyVarnado) for prophylaxis due to contaminated abdominal case per MD.  Goal of Therapy:  Target Gentamicin peak 6-858mcg/ml and trough < 441mcg/ml.  Plan:  1. Gentamicin 150mg  IV x 1 mixed with Clindamycin 900mg  IV. 2. Please notify pharmacy if further dosing is needed. Thanks!  Claybon Jabsngel, Shaneil Yazdi G 07/24/2013,6:26 PM

## 2013-07-24 NOTE — Progress Notes (Signed)
At bedside to assess pt, meet family. Pt s/p fentanyl. Comfortable except with contractions.  Pt has urge to push. VSS. EM:  A-early decels, mild variable decels.  Accelerations present. B-Accelerations present, good variability.  No decels. Contractions q 1-3 min, couplets present  CVX: Still 8 cm, cervix is edematous most prominent on anterior lip. Not stretchy. +2 station, vertex.  A/P: Twin IUP at 37 5/7 weeks, Active labor Give 1/2 of previous dose of Fentanyl. Pt instructed to not bear down with contractions.  Instructed on breathing exercises. Discussed potential need for breech extraction/cesarean section but pt "in the zone" and did not respond. Do bedside ultrasound to become familiar with presentation of Twin B.

## 2013-07-24 NOTE — Op Note (Signed)
Jeanne Stark, GLADE NO.:  [id number]  MEDICAL RECORD NO.:  [id number]  LOCATION:  [facility]                          FACILITY:  [facility]  PHYSICIAN:  Jeanne Partridge, MD   DATE OF BIRTH:  [birth year]  DATE OF PROCEDURE:  07/24/2013 DATE OF DISCHARGE:                              OPERATIVE REPORT   PREOPERATIVE DIAGNOSES: 1. Intrauterine pregnancy at 5 and 7th weeks, twin gestation. 2. Status post spontaneous vaginal delivery of baby A. 3. Nonreassuring fetal heart tones asynclitic presentation of baby B,     failed vacuum. 4. Vaginal laceration.  POSTOPERATIVE DIAGNOSES: 1. Intrauterine pregnancy at 5 and 7th weeks, twin gestation. 2. Status post spontaneous vaginal delivery of baby A. 3. Nonreassuring fetal heart tones asynclitic presentation of baby B,     failed vacuum. 4. Vaginal laceration. 5. Brow presentation.  PROCEDURE:  Primary low transverse cesarean section with repair of vaginal laceration.  SURGEON:  Dr. Pieter Stark.  ASSISTANT:  Jeanne Stark, CNM and technician.  ANESTHESIA:  Local and general.  ESTIMATED BLOOD LOSS:  500.  URINE OUT:  100 mL of clear urine at the end of the case.  IV FLUIDS IN:  1500.  DRAINS:  Foley catheter.  Local lidocaine was used after the laceration noted vaginally.  SPECIMEN:  Placenta and membranes x2.  Cord B with 2 clamps.  DISPOSITION:  To PACU hemodynamically stable.  COMPLICATIONS:  Vaginal laceration.  FINDINGS:  Baby A was viable, delivered vaginally with Apgars of 9 and 9.  Weight is 5 pounds 5.9 ounces.  Baby girl delivered, Apgars of 6 and 9, 5 pounds 9 ounces.  Baby B, cord pH was 7.136 and 7.144, arterial and venous respectively.  Normal uterus.  Ovaries not visualized.  PROCEDURE IN DETAIL:  Jeanne Stark is a 23 year old, gravida 2, para 1-0-0- 1, who presented overnight with complaint of ruptured membranes and progressed to active labor, at 37 weeks and 5 days.  She had a known twin  gestation, vertex and transverse presentation.  The patient was counseled on risk of vaginal delivery versus planned cesarean section. Patient desired to labor with delivery of baby A vaginally and rotation of baby B in breech extraction or rotation of baby B to vertex.  She did not want an epidural and was counseled on the risk of potential general anesthesia if necessary for emergency C-section.  The patient was taken to the operating room.  She delivered in the labor and delivery bed because baby A was delivering quite fast.  Baby girl A delivered in a normal fashion.  Nose and mouth were suctioned, and cord was clamped and cut.  Baby handed off to awaiting staff.  There was a very small superficial abrasion of the peritoneum that did not require any repair.  Baby B was intact with intact membranes after delivery of baby girl A.  With ultrasound present, baby was rotated from what felt like a breech presentation to vertex.  She was allowed to bring the baby down and the amnion was ruptured with clear fluid noted.  Patient then continued to push and labored down to a +1, +2 station, and the baby felt to be  LOP.  Baby was then rotated while patient was pushing. During this time, the baby was having deep variable decelerations with good variability throughout and returned to the baseline.  At some point, they did have some lay component, but overall were returning to baseline appropriately.  As the mother began to push adequately, the heart rate over time, the variables continued.  At that time, the patient was counseled on a vacuum-assisted vaginal delivery with risk of hematoma and laceration to the vagina or cervix.  Patient desires to proceed with vacuum instead of proceeding with C-section.  Vacuum mushroom initially was used and the vertex felt like it was coming up or coming down, but there was 1 pop off.  The second time, I used the mushroom there was not adequate suction.  So, I  then turned to the Bell vacuum and that did not allow for the descent of the baby.  Due to the nonreassuring fetal heart tones, I suspected asynclitic presentation and at this point, it was almost 45 minutes to an hour after the delivery of the first baby.  Patient was counseled on primary low transverse cesarean section for delivery of baby B.  At that time, the section was called.  The fetal heart rates were in the 130s and then to 150.  Patient was given the alternative to have anesthesia, but she was opposed to spinal and wanted to have general endotracheal anesthesia, and so her wishes were granted.  The laceration was noted after the second attempt with the vacuum.  The edges were then grasped with 2 ring forceps for hemostasis and that would not be removed until the end of the cesarean section.  The patient was then moved to the operating room table.  She was then prepped and draped in normal sterile fashion.  She was in the supine position.  Once she was draped, she underwent general endotracheal anesthesia.  A Pfannenstiel skin incision was made with a scalpel and carried down to the underlying layer of the fascia with the Bovie.  The fascia was then extended laterally.  The Kocher clamps were used to tent up the fascia and the rectus muscles were dissected sharply off of the fascia above and below.  The muscle was separated at the midline.  Peritoneum was tented up with hemostats x2 and entered sharply.  Peritoneum was stretched.  Bladder blade was inserted.  Bladder flap was developed with the Metzenbaum scissors and the Russians.  A transverse incision was then made on the lower uterine segment of the uterus and extended manually.  The head was palpated and a brow presentation was noted. Head was brought to the incision and delivered easily.  Nose and mouth were suctioned.  Cord was clamped x2 and handed off to the awaiting NICU team.  Baby was floppy, but crying a little  bit, __________ consistent with general anesthesia.  Clamps were placed on the second cord clamp. The placenta was removed from the uterus and then at that point, we realized the cord clamp of baby A was still in the vagina.  At that point, we got a second clamp, clamped it off, and closed it into the uterus and that will be removed through the vagina.  The placenta was removed __________ membranes.  I failed to clean out the uterus with a moist laparotomy sponge.  I would do that at the time of vaginal repair. The hysterotomy incision was closed in a running locked fashion with 0 chromic.  A  second layer of the same suture was used for imbrication. Copious irrigation of the abdomen was performed.  Bovie was used for cautery as needed.  Interceed was applied to the lower uterine segment. Peritoneum was then grasped with Kelly clamps and closed with 2-0 Vicryl.  Fascia was then reapproximated with 0 Vicryl in a continuous running fashion.  The patient had very little subcutaneous fat, so subcuticular stitches were not done.  The skin was reapproximated with 4- 0 Vicryl on a Keith needle.  Steri-Strips and a pressure dressing were applied.  Patient was then placed in lithotomy position.  I initially thought that might have been a cervical laceration, but it was difficult to tell with the patient awake and uncomfortable.  But with exploration of the vagina, it was noted that it was a vaginal laceration in the anterior vaginal wall, extending into the fornix.  A weighted speculum were placed in the vagina and the laceration was closed.  The ring forceps were taken off.  There was adequate blood flow to the tissue. Actually, there was a active bleeding on the left fornix.  The mucosa was reapproximated taking note to avoid any bladder or go deep.  A running locked suture was placed with 0 Vicryl.  It was hemostatic. Once the repair was complete, the cervix was examined for any bleeding or  laceration.  Yankauer was used to suction the intrauterine cavity, because it could not get a sponge in.  I examined the perineum, there was a small laceration noted in the vagina.  Otherwise, appeared to be normal and hemostatic.  At the end of the case, urine in the catheter was clear ,and of note, an I/O catherization of the bladder was performed after delivery of baby A and that was sent for protein/creatinine ratio.  Patient did receive Ancef 2 g IV.  I plan for her to have continued antibiotic therapy for 24 hours for prophylaxis.  We did take a time-out prior to the incision.  At that time, the baby was reassuring and the heart rates were in the 150s.  SCDs were on in operating and patient tolerated the procedure well.  She was awakened in the OR, taken to the recovery room in stable condition.     Jeanne Partridge, MD     EBV/MEDQ  D:  07/24/2013  T:  07/24/2013  Job:  [id number]

## 2013-07-24 NOTE — H&P (Signed)
Jeanne Stark is a 23 y.o. female, G2P1001 at 3742w5d, presenting for SROM and possible UCs. U/S on 3/19 showed - baby A: Right lower fetus, cephalic and baby B: Upper left fetus, transverse head to maternal right.    Patient Active Problem List   Diagnosis Date Noted  . Irregular uterine contractions 07/22/2013  . Normal labor 07/21/2013  . 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 Back pain complicating pregnancy 06/21/2013  . Twins 06/21/2013  . Gallstones 06/21/2013  . 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 Threatened preterm labor 05/27/2013    History of present pregnancy: Patient entered care at 13 weeks.  EDC of 08/09/13 was established by LMP.  Anatomy scan: 18 weeks, with normal findings. Twin A - posterior placenta, Twin B - posterior placenta.  Additional US evaluations: 4924w2d - Di/Di twin gestation, twin A vertex post placenta, normal fluid, EFW 24th%ile, twin B transverse lie, posterior placenta, normal fluid, EFW 41st%ile.  Significant prenatal events: none  Last evaluation: 07/20/13 at 4987w1d 1cm / 30% / -3   OB History   Grav Para Term Preterm Abortions TAB SAB Ect Mult Living   2 1 1  0 0 0 0 0 0 1     Past Medical History  Diagnosis Date  . Gall stones   . Herpes   . Hx of carpal tunnel syndrome   . Preterm labor    Past Surgical History  Procedure Laterality Date  . Wisdom tooth extraction    . No past surgeries     Family History: family history includes COPD in her maternal aunt and maternal grandmother; Hyperlipidemia in her mother. Social History:  reports that she has never smoked. She has never used smokeless tobacco. She reports that she does not drink alcohol or use illicit drugs.   Prenatal Transfer Tool  Maternal Diabetes: No Genetic Screening: Normal Maternal Ultrasounds/Referrals: Normal Fetal Ultrasounds or other Referrals:  None Maternal  Substance Abuse:  No Significant Maternal Medications:  Meds include: Other: Valtrex started at 34 weeks Significant Maternal Lab Results: Lab values include: Group B Strep negative    ROS: see HPI above, all other systems are negative   No Known Allergies      Blood pressure 131/74, pulse 101, temperature 98.1 F (36.7 C), temperature source Oral, resp. rate 20, last menstrual period 11/02/2012.  Chest clear Heart RRR without murmur Abd gravid, NT Ext: WNL Pelvic exam: normal external genitalia, vulva, vagina, cervix, uterus and adnexa. No lesions noted on exam 3 / 80 / -1 vtx, anterior  FHR: Cat I x 2 UCs:  Q 3 min  Prenatal labs: ABO, Rh: --/--/O POS (03/18 2240) Antibody: NEG (03/18 2240) Rubella:   Immune RPR: NON REACTIVE (03/18 2220)  HBsAg: Negative (09/09 0000)  HIV: Non-reactive (09/09 0000)  GBS: Negative (03/11 0000) Sickle cell/Hgb electrophoresis:  Normal Pap: 08/31/12 WNL  GC: Neg  Chlamydia: Neg  Genetic screenings: AFP neg  Glucola: 91  Other: none   Assessment/Plan: IUP at 4142w5d Di/Di twins SROM and possible labor GBS neg HSV pos - on Valtrex since 34 weeks   Admit to BS per c/w Dr. Dion BodyVarnado  Routine L&D orders  IV pain medication and/or epidural prn   Rowan BlaseXLEY, JENNIFERCNM, MSN 07/24/2013, 2:17 AM

## 2013-07-25 LAB — CBC
HEMATOCRIT: 23.8 % — AB (ref 36.0–46.0)
HEMATOCRIT: 25.9 % — AB (ref 36.0–46.0)
HEMOGLOBIN: 7.6 g/dL — AB (ref 12.0–15.0)
Hemoglobin: 8.3 g/dL — ABNORMAL LOW (ref 12.0–15.0)
MCH: 23.6 pg — AB (ref 26.0–34.0)
MCH: 23.8 pg — AB (ref 26.0–34.0)
MCHC: 31.9 g/dL (ref 30.0–36.0)
MCHC: 32 g/dL (ref 30.0–36.0)
MCV: 73.9 fL — AB (ref 78.0–100.0)
MCV: 74.2 fL — AB (ref 78.0–100.0)
Platelets: 134 10*3/uL — ABNORMAL LOW (ref 150–400)
Platelets: 162 10*3/uL (ref 150–400)
RBC: 3.22 MIL/uL — AB (ref 3.87–5.11)
RBC: 3.49 MIL/uL — ABNORMAL LOW (ref 3.87–5.11)
RDW: 16.1 % — ABNORMAL HIGH (ref 11.5–15.5)
RDW: 16.2 % — AB (ref 11.5–15.5)
WBC: 13.3 10*3/uL — ABNORMAL HIGH (ref 4.0–10.5)
WBC: 13.2 10*3/uL — AB (ref 4.0–10.5)

## 2013-07-25 NOTE — Progress Notes (Signed)
Subjective: Postpartum Day 1 Cesarean Delivery Twin B and Vaginal Delivery Twin A Patient reports feeling well this afternoon.  States she has ambulated to bathroom and in hall without weakness or dizziness and reports pain is well controlled.  Pos flatus, neg BM.      Objective:  Filed Vitals:   07/25/13 1035 07/25/13 1040 07/25/13 1050 07/25/13 1219  BP: 131/75 148/88 133/84   Pulse: 118 117 117   Temp:    98.4 F (36.9 C)  TempSrc:    Oral  Resp: 16 18 18    Height:      Weight:      SpO2: 99% 98% 98%    Physical Exam:  General: alert, cooperative and no distress Heart Rate: 100 apical, regular. Abd:  Distended but soft with pos BS x 4 quads. Lochia: appropriate Uterine Fundus: firm Incision: Dsg intact   Recent Labs  07/25/13 0601 07/25/13 1347  HGB 7.6* 8.3*  HCT 23.8* 25.9*    Assessment/Plan: Status post Cesarean section - stable Asymptomatic postoperative anemia  CBC from earlier this afternoon with increased Hgb to 8.3 from 7.6.   Continue current care.  Jeanne Stark O. 07/25/2013, 5:44 PM

## 2013-07-25 NOTE — Progress Notes (Signed)
Subjective: Postop Day 1: Cesarean, SVD C/o incisional soreness. Pain controlled with Percocet. No pain from vaginal laceration.  Denies dysuria or abdominal pain with urination. Lochia normal.  Breast feeding no. Has not ambulated in halls.  No flatus, no n/v.  Objective: Temp:  [98.1 F (36.7 C)-99.3 F (37.4 C)] 98.4 F (36.9 C) (03/22 1219) Pulse Rate:  [72-125] 117 (03/22 1050) Resp:  [13-20] 18 (03/22 1050) BP: (118-148)/(63-88) 133/84 mmHg (03/22 1050) SpO2:  [97 %-100 %] 98 % (03/22 1050)  Physical Exam: Gen: NAD Lochia: Not visualized Uterine Fundus: firm, appropriately tender Incision: Dressing clean and dry. DVT Evaluation: 2+ Edema present, no calf tenderness bilaterally Abdomen:  Distended, tympanic to percussion.    Recent Labs  07/24/13 1703 07/25/13 0601  HGB 8.8* 7.6*  HCT 27.5* 23.8*    Assessment/Plan: Status post C-section and SVD of twin gestation-doing well postoperatively.  Encouraged ambulation in halls TID. BP elevated intrapartum.  Normal pr/cr ratio.  BP normal mild elevation x 1.  Continue to observe. Anemic-On iron therapy.  Tachycardiac but asymptomatic.  Repeat Hg this afternoon to assess stable Hg. Continue routine postop care. Discuss with CNM to f/u on labs and pt's VS.      Jeanne Stark 07/25/2013, 12:59 PM

## 2013-07-25 NOTE — Lactation Note (Signed)
This note was copied from the chart of Girl GrenadaBrittany Vercher. Lactation Consultation Note  Patient Name: Girl Fortino SicBrittany Gorton MVHQI'OToday's Date: 07/25/2013 Reason for consult: Initial assessment;Multiple gestation;Late preterm infant   Maternal Data Formula Feeding for Exclusion: Yes Reason for exclusion: Mother's choice to formula feed on admision  Feeding    LATCH Score/Interventions                      Lactation Tools Discussed/Used     Consult Status Consult Status: Complete    Alfred LevinsLee, Markita Stcharles Anne 07/25/2013, 1:55 PM

## 2013-07-25 NOTE — Anesthesia Postprocedure Evaluation (Signed)
  Anesthesia Post-op Note  Anesthesia Post Note  Patient: Jeanne Stark ([place] N Milling  Procedure(s) Performed: Procedure(s) (LRB): Emergent CESAREAN SECTION, repair of vaginal laceration (N/A)  Anesthesia type: General  Patient location: mother baby  Post pain: Pain level controlled  Post assessment: Post-op Vital signs reviewed  Last Vitals:  Filed Vitals:   07/25/13 0821  BP: 128/82  Pulse: 96  Temp: 37.4 C  Resp: 17    Post vital signs: Reviewed  Level of consciousness: sedated  Complications: No apparent anesthesia complications

## 2013-07-25 NOTE — Addendum Note (Signed)
Addendum created 07/25/13 1012 by Turner DanielsJennifer L Izeyah Deike, CRNA   Modules edited: Notes Section   Notes Section:  File: [id number]

## 2013-07-26 ENCOUNTER — Encounter (HOSPITAL_COMMUNITY): Payer: Self-pay | Admitting: Obstetrics and Gynecology

## 2013-07-26 ENCOUNTER — Inpatient Hospital Stay (HOSPITAL_COMMUNITY): Admission: RE | Admit: 2013-07-26 | Payer: BC Managed Care – PPO | Source: Ambulatory Visit

## 2013-07-26 DIAGNOSIS — O30009 Twin pregnancy, unspecified number of placenta and unspecified number of amniotic sacs, unspecified trimester: Secondary | ICD-10-CM | POA: Diagnosis not present

## 2013-07-26 NOTE — Progress Notes (Signed)
Subjective: Postpartum Day 2: SVB Twin A, Cesarean Delivery Twin B due to brow presentation, NRFHR.  Vaginal laceration. Patient up ad lib, reports no syncope or dizziness.  Passing flatus, good relief of pain with Motrin and Percocet.  Walking frequently.  Wants BTL--signed consent 07/06/13. Feeding:  Bottle Contraceptive plan:  Wants BTL  Objective: Vital signs in last 24 hours: Temp:  [98.4 F (36.9 C)-99.3 F (37.4 C)] 98.5 F (36.9 C) (03/22 1821) Pulse Rate:  [96-118] 102 (03/22 1821) Resp:  [16-18] 18 (03/22 1821) BP: (128-149)/(75-89) 149/89 mmHg (03/22 1821) SpO2:  [98 %-99 %] 98 % (03/22 1050)  Physical Exam:  General: alert Lochia: appropriate Uterine Fundus: firm Incision: Honeycomb dressing CDI Abdomen:  Moderate distension, but + bowel sounds and flatus. DVT Evaluation: No evidence of DVT seen on physical exam. Negative Homan's sign. JP drain:   NA   Recent Labs  07/25/13 0601 07/25/13 1347  HGB 7.6* 8.3*  HCT 23.8* 25.9*  Orthostatics stable. On Fe BID.  Assessment/Plan: Status post Cesarean section and SVB of twins, day 2 Doing well postoperatively.  Anemia without hemodynamic compromise. Continue current care. Plan for discharge tomorrow Declines transfusion.   Nigel BridgemanLATHAM, Denali Becvar 07/26/2013, 6:34 AM

## 2013-07-26 NOTE — Discharge Summary (Signed)
Cesarean Section Delivery Discharge Summary  Jeanne Stark  DOB:    11/09/1990 MRN:    [id number] CSN:    [id number]  Date of admission:                  07/24/13  Date of discharge:                   07/27/13  Procedures this admission:  Vaginal delivery Twin A, failed VE of Twin B, with primary LTCS delivery of Twin B, repair of vaginal laceration, general anesthesia for C/S.  Date of Delivery: 07/24/13  Newborn Data:    Jeanne, Butrick Girl Stark [[id number]]  Live born female  Birth Weight: 5 lb 5.9 oz (2435 g) APGAR: 9, 9   Jeanne, Stark [[id number]]  Live born female  Birth Weight: 5 lb 9.1 oz (2525 g) APGAR: 6, 9  Home with mother.  History of Present Illness:  Ms. Jeanne Stark is a 23 y.o. female, G2P2003, who presents at [redacted]w[redacted]d weeks gestation. The patient has been followed at the [facility] and Gynecology division of [facility].    Her pregnancy has been complicated by:  Patient Active Problem List   Diagnosis Date Noted  . Twin delivery--1 twin C/S, 1 twin vag delivery 07/26/2013  . 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 Back pain complicating pregnancy 06/21/2013  . Twins 06/21/2013  . Gallstones 06/21/2013  . 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   Twin A was vtx, and twin B was transverse on last [place] 3/19.  She labored spontaneously, with IV pain med as needed.  She progressed to fully dilated, with vaginal delivery of Twin A in the labor room.  She was taken to the OR for delivery of Twin B, who had an asynclitic presentation and a failed VE, due to brow presentation, and NRFHR.  Dr. Dion Body was on call for [facility] and attended the patient's birth, along with Sanda Klein, CNM.  Twin B was delivered by [facility], with patient under general due  to her declining regional anesthesia.  She and the babies tolerated the procedures well.  Patient had a small superficial perineal abrasion, not requiring repair, and a vagina laceration repaired after the C/S was completed. She received Ancef for 24 hours after delivery.  Her postpartum course was not complicated. She had anemia without hemodynamic instability, with Hgb 8.3 on day 1, down from 8.8 at admission. She was discharged to home on postpartum day 3 doing well.    Feeding:  bottle  Contraception:  Wants BTL--consent signed 07/06/13.  Discharge hemoglobin:  Hemoglobin  Date Value Ref Range Status  07/25/2013 8.3* 12.0 - 15.0 g/dL Final     HCT  Date Value Ref Range Status  07/25/2013 25.9* 36.0 - 46.0 % Final    Discharge Physical Exam:   General: alert Lochia: appropriate Uterine Fundus: firm Incision: Vaginal laceration healing well, C/S incision honeycomb dressing CDI. DVT Evaluation: No evidence of DVT seen on physical exam. Negative Homan's sign.  Intrapartum Procedures: spontaneous vaginal delivery, cesarean: low cervical, transverse and twin A delivered vaginally, and Twin B by LTCS due to failed VE and NRFHR. Postpartum Procedures: none Complications-Operative and Postpartum: vaginal laceration  Discharge Diagnoses: Term Pregnancy-delivered and twins, SVB Twin A, failed vacumn of Twin B with primary LTCS delivery.  Discharge Information:  Activity:           pelvic rest Diet:                routine Medications: Ibuprofen, Iron and Percocet Condition:      stable Instructions:  Care After Cesarean Delivery  Refer to this sheet in the next few weeks. These instructions provide you with information on caring for yourself after your procedure. Your caregiver may also give you specific instructions. Your treatment has been planned according to current medical practices, but problems sometimes occur. Call your caregiver if you have any problems or questions after  you go home. HOME CARE INSTRUCTIONS  Only take over-the-counter or prescription medicines as directed by your caregiver.  Do not drink alcohol, especially if you are breastfeeding or taking medicine to relieve pain.  Do not chew or smoke tobacco.  Continue to use good perineal care. Good perineal care includes:  Wiping your perineum from front to back.  Keeping your perineum clean.  Check your cut (incision) daily for increased redness, drainage, swelling, or separation of skin.  Clean your incision gently with soap and water every day, and then pat it dry. If your caregiver says it is okay, leave the incision uncovered. Use a bandage (dressing) if the incision is draining fluid or appears irritated. If the adhesive strips across the incision do not fall off within 7 days, carefully peel them off.  Hug a pillow when coughing or sneezing until your incision is healed. This helps to relieve pain.  Do not use tampons or douche until your caregiver says it is okay.  Shower, wash your hair, and take tub baths as directed by your caregiver.  Wear a well-fitting bra that provides breast support.  Limit wearing support panties or control-top hose.  Drink enough fluids to keep your urine clear or pale yellow.  Eat high-fiber foods such as whole grain cereals and breads, brown rice, beans, and fresh fruits and vegetables every day. These foods may help prevent or relieve constipation.  Resume activities such as climbing stairs, driving, lifting, exercising, or traveling as directed by your caregiver.  Talk to your caregiver about resuming sexual activities. This is dependent upon your risk of infection, your rate of healing, and your comfort and desire to resume sexual activity.  Try to have someone help you with your household activities and your newborn for at least a few days after you leave the hospital.  Rest as much as possible. Try to rest or take a nap when your newborn is  sleeping.  Increase your activities gradually.  Keep all of your scheduled postpartum appointments. It is very important to keep your scheduled follow-up appointments. At these appointments, your caregiver will be checking to make sure that you are healing physically and emotionally. SEEK MEDICAL CARE IF:   You are passing large clots from your vagina. Save any clots to show your caregiver.  You have a foul smelling discharge from your vagina.  You have trouble urinating.  You are urinating frequently.  You have pain when you urinate.  You have a change in your bowel movements.  You have increasing redness, pain, or swelling near your incision.  You have pus draining from your incision.  Your incision is separating.  You have painful, hard, or reddened breasts.  You have a severe headache.  You have blurred vision or see spots.  You feel sad or depressed.  You have thoughts of hurting yourself or your  newborn.  You have questions about your care, the care of your newborn, or medicines.  You are dizzy or lightheaded.  You have a rash.  You have pain, redness, or swelling at the site of the removed intravenous access (IV) tube.  You have nausea or vomiting.  You stopped breastfeeding and have not had a menstrual period within 12 weeks of stopping.  You are not breastfeeding and have not had a menstrual period within 12 weeks of delivery.  You have a fever. SEEK IMMEDIATE MEDICAL CARE IF:  You have persistent pain.  You have chest pain.  You have shortness of breath.  You faint.  You have leg pain.  You have stomach pain.  Your vaginal bleeding saturates 2 or more sanitary pads in 1 hour. MAKE SURE YOU:   Understand these instructions.  Will watch your condition.  Will get help right away if you are not doing well or get worse. Document Released: 01/12/2002 Document Revised: 01/15/2012 Document Reviewed: 12/18/2011 [facility] Patient Information  2014 [place], [place].   Postpartum Depression and Baby Blues  The postpartum period begins right after the birth of a baby. During this time, there is often a great amount of joy and excitement. It is also a time of considerable changes in the life of the parent(s). Regardless of how many times a mother gives birth, each child brings new challenges and dynamics to the family. It is not unusual to have feelings of excitement accompanied by confusing shifts in moods, emotions, and thoughts. All mothers are at risk of developing postpartum depression or the "baby blues." These mood changes can occur right after giving birth, or they may occur many months after giving birth. The baby blues or postpartum depression can be mild or severe. Additionally, postpartum depression can resolve rather quickly, or it can be a long-term condition. CAUSES Elevated hormones and their rapid decline are thought to be a main cause of postpartum depression and the baby blues. There are a number of hormones that radically change during and after pregnancy. Estrogen and progesterone usually decrease immediately after delivering your baby. The level of thyroid hormone and various cortisol steroids also rapidly drop. Other factors that play a major role in these changes include major life events and genetics.  RISK FACTORS If you have any of the following risks for the baby blues or postpartum depression, know what symptoms to watch out for during the postpartum period. Risk factors that may increase the likelihood of getting the baby blues or postpartum depression include:  Havinga personal or family history of depression.  Having depression while being pregnant.  Having premenstrual or oral contraceptive-associated mood issues.  Having exceptional life stress.  Having marital conflict.  Lacking a social support network.  Having a baby with special needs.  Having health problems such as diabetes. SYMPTOMS Baby  blues symptoms include:  Brief fluctuations in mood, such as going from extreme happiness to sadness.  Decreased concentration.  Difficulty sleeping.  Crying spells, tearfulness.  Irritability.  Anxiety. Postpartum depression symptoms typically begin within the first month after giving birth. These symptoms include:  Difficulty sleeping or excessive sleepiness.  Marked weight loss.  Agitation.  Feelings of worthlessness.  Lack of interest in activity or food. Postpartum psychosis is a very concerning condition and can be dangerous. Fortunately, it is rare. Displaying any of the following symptoms is cause for immediate medical attention. Postpartum psychosis symptoms include:  Hallucinations and delusions.  Bizarre or disorganized behavior.  Confusion or  disorientation. DIAGNOSIS  A diagnosis is made by an evaluation of your symptoms. There are no medical or lab tests that lead to a diagnosis, but there are various questionnaires that a caregiver may use to identify those with the baby blues, postpartum depression, or psychosis. Often times, a screening tool called the [place] Postnatal Depression Scale is used to diagnose depression in the postpartum period.  TREATMENT The baby blues usually goes away on its own in 1 to 2 weeks. Social support is often all that is needed. You should be encouraged to get adequate sleep and rest. Occasionally, you may be given medicines to help you sleep.  Postpartum depression requires treatment as it can last several months or longer if it is not treated. Treatment may include individual or group therapy, medicine, or both to address any social, physiological, and psychological factors that may play a role in the depression. Regular exercise, a healthy diet, rest, and social support may also be strongly recommended.  Postpartum psychosis is more serious and needs treatment right away. Hospitalization is often needed. HOME CARE  INSTRUCTIONS  Get as much rest as you can. Nap when the baby sleeps.  Exercise regularly. Some women find yoga and walking to be beneficial.  Eat a balanced and nourishing diet.  Do little things that you enjoy. Have a cup of tea, take a bubble bath, read your favorite magazine, or listen to your favorite music.  Avoid alcohol.  Ask for help with household chores, cooking, grocery shopping, or running errands as needed. Do not try to do everything.  Talk to people close to you about how you are feeling. Get support from your partner, family members, friends, or other new moms.  Try to stay positive in how you think. Think about the things you are grateful for.  Do not spend a lot of time alone.  Only take medicine as directed by your caregiver.  Keep all your postpartum appointments.  Let your caregiver know if you have any concerns. SEEK MEDICAL CARE IF: You are having a reaction or problems with your medicine. SEEK IMMEDIATE MEDICAL CARE IF:  You have suicidal feelings.  You feel you may harm the baby or someone else. Document Released: 01/25/2004 Document Revised: 07/15/2011 Document Reviewed: 02/26/2011 [facility] Patient Information 2014 [place].  Discharge to: home  Follow-up Information   [facility]. Schedule an appointment as soon as possible for a visit in 6 weeks. (Call for any questions or concerns.)    Specialty:  Obstetrics and Gynecology   Contact information:   [address] [place] [id number] [phone]       Nigel BridgemanLATHAM, Kiam Bransfield 07/26/2013

## 2013-07-27 MED ORDER — OXYCODONE-ACETAMINOPHEN 5-325 MG PO TABS: 1.0000 | ORAL_TABLET | ORAL | Status: DC | PRN

## 2013-07-27 MED ORDER — IBUPROFEN 600 MG PO TABS: 600.0000 mg | ORAL_TABLET | Freq: Four times a day (QID) | ORAL | Status: DC | PRN

## 2013-07-27 MED ORDER — FERROUS SULFATE 325 (65 FE) MG PO TABS: 325.0000 mg | ORAL_TABLET | Freq: Every day | ORAL | Status: DC

## 2013-07-27 NOTE — Discharge Instructions (Signed)
Postpartum Care After Cesarean Delivery °After you deliver your newborn (postpartum period), the usual stay in the hospital is 24 72 hours. If there were problems with your labor or delivery, or if you have other medical problems, you might be in the hospital longer.  °While you are in the hospital, you will receive help and instructions on how to care for yourself and your newborn during the postpartum period.  °While you are in the hospital: °· It is normal for you to have pain or discomfort from the incision in your abdomen. Be sure to tell your nurses when you are having pain, where the pain is located, and what makes the pain worse. °· If you are breastfeeding, you may feel uncomfortable contractions of your uterus for a couple of weeks. This is normal. The contractions help your uterus get back to normal size. °· It is normal to have some bleeding after delivery. °· For the first 1 3 days after delivery, the flow is red and the amount may be similar to a period. °· It is common for the flow to start and stop. °· In the first few days, you may pass some small clots. Let your nurses know if you begin to pass large clots or your flow increases. °· Do not  flush blood clots down the toilet before having the nurse look at them. °· During the next 3 10 days after delivery, your flow should become more watery and pink or brown-tinged in color. °· Ten to fourteen days after delivery, your flow should be a small amount of yellowish-white discharge. °· The amount of your flow will decrease over the first few weeks after delivery. Your flow may stop in 6 8 weeks. Most women have had their flow stop by 12 weeks after delivery. °· You should change your sanitary pads frequently. °· Wash your hands thoroughly with soap and water for at least 20 seconds after changing pads, using the toilet, or before holding or feeding your newborn. °· Your intravenous (IV) tubing will be removed when you are drinking enough fluids. °· The  urine drainage tube (urinary catheter) that was inserted before delivery may be removed within 6 8 hours after delivery or when feeling returns to your legs. You should feel like you need to empty your bladder within the first 6 8 hours after the catheter has been removed. °· In case you become weak, lightheaded, or faint, call your nurse before you get out of bed for the first time and before you take a shower for the first time. °· Within the first few days after delivery, your breasts may begin to feel tender and full. This is called engorgement. Breast tenderness usually goes away within 48 72 hours after engorgement occurs. You may also notice milk leaking from your breasts. If you are not breastfeeding, do not stimulate your breasts. Breast stimulation can make your breasts produce more milk. °· Spending as much time as possible with your newborn is very important. During this time, you and your newborn can feel close and get to know each other. Having your newborn stay in your room (rooming in) will help to strengthen the bond with your newborn. It will give you time to get to know your newborn and become comfortable caring for your newborn. °· Your hormones change after delivery. Sometimes the hormone changes can temporarily cause you to feel sad or tearful. These feelings should not last more than a few days. If these feelings last longer   than that, you should talk to your caregiver.  If desired, talk to your caregiver about methods of family planning or contraception.  Talk to your caregiver about immunizations. Your caregiver may want you to have the following immunizations before leaving the hospital:  Tetanus, diphtheria, and pertussis (Tdap) or tetanus and diphtheria (Td) immunization. It is very important that you and your family (including grandparents) or others caring for your newborn are up-to-date with the Tdap or Td immunizations. The Tdap or Td immunization can help protect your newborn  from getting ill.  Rubella immunization.  Varicella (chickenpox) immunization.  Influenza immunization. You should receive this annual immunization if you did not receive the immunization during your pregnancy. Document Released: 01/15/2012 Document Reviewed: 01/15/2012 [facility] Patient Information 2014 [place], [place].  Postpartum Care After Vaginal Delivery After you deliver your newborn (postpartum period), the usual stay in the hospital is 24 72 hours. If there were problems with your labor or delivery, or if you have other medical problems, you might be in the hospital longer.  While you are in the hospital, you will receive help and instructions on how to care for yourself and your newborn during the postpartum period.  While you are in the hospital:  Be sure to tell your nurses if you have pain or discomfort, as well as where you feel the pain and what makes the pain worse.  If you had an incision made near your vagina (episiotomy) or if you had some tearing during delivery, the nurses may put ice packs on your episiotomy or tear. The ice packs may help to reduce the pain and swelling.  If you are breastfeeding, you may feel uncomfortable contractions of your uterus for a couple of weeks. This is normal. The contractions help your uterus get back to normal size.  It is normal to have some bleeding after delivery.  For the first 1 3 days after delivery, the flow is red and the amount may be similar to a period.  It is common for the flow to start and stop.  In the first few days, you may pass some small clots. Let your nurses know if you begin to pass large clots or your flow increases.  Do not  flush blood clots down the toilet before having the nurse look at them.  During the next 3 10 days after delivery, your flow should become more watery and pink or brown-tinged in color.  Ten to fourteen days after delivery, your flow should be a small amount of yellowish-white  discharge.  The amount of your flow will decrease over the first few weeks after delivery. Your flow may stop in 6 8 weeks. Most women have had their flow stop by 12 weeks after delivery.  You should change your sanitary pads frequently.  Wash your hands thoroughly with soap and water for at least 20 seconds after changing pads, using the toilet, or before holding or feeding your newborn.  You should feel like you need to empty your bladder within the first 6 8 hours after delivery.  In case you become weak, lightheaded, or faint, call your nurse before you get out of bed for the first time and before you take a shower for the first time.  Within the first few days after delivery, your breasts may begin to feel tender and full. This is called engorgement. Breast tenderness usually goes away within 48 72 hours after engorgement occurs. You may also notice milk leaking from your breasts. If  you are not breastfeeding, do not stimulate your breasts. Breast stimulation can make your breasts produce more milk.  Spending as much time as possible with your newborn is very important. During this time, you and your newborn can feel close and get to know each other. Having your newborn stay in your room (rooming in) will help to strengthen the bond with your newborn. It will give you time to get to know your newborn and become comfortable caring for your newborn.  Your hormones change after delivery. Sometimes the hormone changes can temporarily cause you to feel sad or tearful. These feelings should not last more than a few days. If these feelings last longer than that, you should talk to your caregiver.  If desired, talk to your caregiver about methods of family planning or contraception.  Talk to your caregiver about immunizations. Your caregiver may want you to have the following immunizations before leaving the hospital:  Tetanus, diphtheria, and pertussis (Tdap) or tetanus and diphtheria (Td)  immunization. It is very important that you and your family (including grandparents) or others caring for your newborn are up-to-date with the Tdap or Td immunizations. The Tdap or Td immunization can help protect your newborn from getting ill.  Rubella immunization.  Varicella (chickenpox) immunization.  Influenza immunization. You should receive this annual immunization if you did not receive the immunization during your pregnancy. Document Released: 02/17/2007 Document Revised: 01/15/2012 Document Reviewed: 12/18/2011 [facility] Patient Information 2014 [place], [place].   Iron-Rich Diet  An iron-rich diet contains foods that are good sources of iron. Iron is an important mineral that helps your body produce hemoglobin. Hemoglobin is a protein in red blood cells that carries oxygen to the body's tissues. Sometimes, the iron level in your blood can be low. This may be caused by: A lack of iron in your diet. Blood loss. Times of growth, such as during pregnancy or during a child's growth and development. Low levels of iron can cause a decrease in the number of red blood cells. This can result in iron deficiency anemia. Iron deficiency anemia symptoms include: Tiredness. Weakness. Irritability. Increased chance of infection. Here are some recommendations for daily iron intake: Males older than 25 years of age need 8 mg of iron per day. Women ages 2519 to 7150 need 18 mg of iron per day. Pregnant women need 27 mg of iron per day, and women who are over 25 years of age and breastfeeding need 9 mg of iron per day. Women over the age of 25 need 8 mg of iron per day. SOURCES OF IRON There are 2 types of iron that are found in food: heme iron and nonheme iron. Heme iron is absorbed by the body better than nonheme iron. Heme iron is found in meat, poultry, and fish. Nonheme iron is found in grains, beans, and vegetables. Heme Iron Sources Food / Iron (mg) Chicken liver, 3 oz (85 g)/ 10 mg Beef  liver, 3 oz (85 g)/ 5.5 mg Oysters, 3 oz (85 g)/ 8 mg Beef, 3 oz (85 g)/ 2 to 3 mg Shrimp, 3 oz (85 g)/ 2.8 mg [place], 3 oz (85 g)/ 2 mg Chicken, 3 oz (85 g) / 1 mg Fish (tuna, halibut), 3 oz (85 g)/ 1 mg Pork, 3 oz (85 g)/ 0.9 mg Nonheme Iron Sources Food / Iron (mg) Ready-to-eat breakfast cereal, iron-fortified / 3.9 to 7 mg Tofu,  cup / 3.4 mg Kidney beans,  cup / 2.6 mg Baked potato with skin /  2.7 mg Asparagus,  cup / 2.2 mg Avocado / 2 mg Dried peaches,  cup / 1.6 mg Raisins,  cup / 1.5 mg Soy milk, 1 cup / 1.5 mg Whole-wheat bread, 1 slice / 1.2 mg Spinach, 1 cup / 0.8 mg Broccoli,  cup / 0.6 mg IRON ABSORPTION Certain foods can decrease the body's absorption of iron. Try to avoid these foods and beverages while eating meals with iron-containing foods: Coffee. Tea. Fiber. Soy. Foods containing vitamin C can help increase the amount of iron your body absorbs from iron sources, especially from nonheme sources. Eat foods with vitamin C along with iron-containing foods to increase your iron absorption. Foods that are high in vitamin C include many fruits and vegetables. Some good sources are: Fresh orange juice. Oranges. Strawberries. Mangoes. Grapefruit. Red bell peppers. Green bell peppers. Broccoli. Potatoes with skin. Tomato juice. Document Released: 12/04/2004 Document Revised: 07/15/2011 Document Reviewed: 10/11/2010 [facility] Patient Information 2014 [place], [place].

## 2013-09-09 ENCOUNTER — Encounter (HOSPITAL_COMMUNITY): Payer: Self-pay

## 2013-09-09 ENCOUNTER — Encounter (HOSPITAL_COMMUNITY): Payer: Self-pay | Admitting: *Deleted

## 2013-09-10 ENCOUNTER — Other Ambulatory Visit: Payer: Self-pay | Admitting: Obstetrics and Gynecology

## 2013-09-17 NOTE — H&P (Signed)
  Admission History and Physical Exam for a Gynecology Patient  Jeanne Stark is a 23 y.o. female, G2P2003, who presents for sterilization by way of laparoscopic bilateral salpingectomies. [facility]  OB History   Grav Para Term Preterm Abortions TAB SAB Ect Mult Living   2 2 2  0 0 0 0 0 1 3      Past Medical History  Diagnosis Date  . Gall stones   . Herpes   . Hx of carpal tunnel syndrome   . Preterm labor     No prescriptions prior to admission    Past Surgical History  Procedure Laterality Date  . Wisdom tooth extraction    . No past surgeries    . Cesarean section N/A 07/24/2013    Procedure: Emergent CESAREAN SECTION, repair of vaginal laceration;  Surgeon: Geryl RankinsEvelyn Varnado, MD;  Location: [facility];  Service: Obstetrics;  Laterality: N/A;    No Known Allergies  Family History: family history includes COPD in her maternal aunt and maternal grandmother; Hyperlipidemia in her mother.  Social History:  reports that she has never smoked. She has never used smokeless tobacco. She reports that she does not drink alcohol or use illicit drugs.  Review of systems: See HPI.  Admission Physical Exam:  HEENT:                 Within normal limits Chest:                   Clear Heart:                    Regular rate and rhythm Breasts:                No masses, skin changes, bleeding, or discharge present Abdomen:             Nontender, no masses Extremities:          Grossly normal Neurologic exam: Grossly normal  Pelvic exam:  External genitalia: normal general appearance Vaginal: normal without tenderness, induration or masses Cervix: normal appearance Adnexa: normal bimanual exam Uterus: Normal size shape and consistency  Assessment:  Desires sterilization  Plan:  The patient will undergo a sterilization procedure by way of laparoscopic salpingectomies. She  understands the indications for her surgical procedure as well as her alternative treatment options. She accepts the risk of, but not limited to, anesthetic complications, bleeding, infections, and possible damage to the surrounding organs.   Kirkland Hunrthur Lorimer Tiberio 09/17/2013

## 2013-09-20 ENCOUNTER — Ambulatory Visit (HOSPITAL_COMMUNITY): Payer: Medicaid Other | Admitting: Anesthesiology

## 2013-09-20 ENCOUNTER — Ambulatory Visit (HOSPITAL_COMMUNITY)
Admission: RE | Admit: 2013-09-20 | Discharge: 2013-09-20 | Disposition: A | Discharge status: Home / Self Care | Payer: Medicaid Other | Source: Ambulatory Visit | Attending: Obstetrics and Gynecology | Admitting: Obstetrics and Gynecology

## 2013-09-20 ENCOUNTER — Encounter (HOSPITAL_COMMUNITY): Payer: Self-pay

## 2013-09-20 ENCOUNTER — Encounter (HOSPITAL_COMMUNITY): Payer: Medicaid Other | Admitting: Anesthesiology

## 2013-09-20 ENCOUNTER — Encounter (HOSPITAL_COMMUNITY)
Admission: RE | Disposition: A | Discharge status: Home / Self Care | Payer: Self-pay | Source: Ambulatory Visit | Attending: Obstetrics and Gynecology

## 2013-09-20 DIAGNOSIS — Z302 Encounter for sterilization: Secondary | ICD-10-CM | POA: Diagnosis not present

## 2013-09-20 HISTORY — PX: LAPAROSCOPIC TUBAL LIGATION: SHX1937

## 2013-09-20 LAB — CBC
HCT: 37.5 % (ref 36.0–46.0)
HEMOGLOBIN: 11.7 g/dL — AB (ref 12.0–15.0)
MCH: 23.1 pg — ABNORMAL LOW (ref 26.0–34.0)
MCHC: 31.2 g/dL (ref 30.0–36.0)
MCV: 74.1 fL — ABNORMAL LOW (ref 78.0–100.0)
PLATELETS: 270 10*3/uL (ref 150–400)
RBC: 5.06 MIL/uL (ref 3.87–5.11)
RDW: 18.2 % — AB (ref 11.5–15.5)
WBC: 4.5 10*3/uL (ref 4.0–10.5)

## 2013-09-20 LAB — PREGNANCY, URINE: PREG TEST UR: NEGATIVE

## 2013-09-20 SURGERY — LIGATION, FALLOPIAN TUBE, LAPAROSCOPIC
Anesthesia: General | Site: Abdomen | Laterality: Bilateral

## 2013-09-20 MED ORDER — KETOROLAC TROMETHAMINE 30 MG/ML IJ SOLN
INTRAMUSCULAR | Status: DC | PRN
  Administered 2013-09-20: 30 mg via INTRAVENOUS
  Administered 2013-09-20: 30 mg via INTRAMUSCULAR

## 2013-09-20 MED ORDER — PROPOFOL 10 MG/ML IV EMUL
INTRAVENOUS | Status: AC
  Filled 2013-09-20: qty 20

## 2013-09-20 MED ORDER — MEPERIDINE HCL 25 MG/ML IJ SOLN: 6.2500 mg | INTRAMUSCULAR | Status: DC | PRN

## 2013-09-20 MED ORDER — PROPOFOL 10 MG/ML IV BOLUS
INTRAVENOUS | Status: DC | PRN
  Administered 2013-09-20: 150 mg via INTRAVENOUS

## 2013-09-20 MED ORDER — KETOROLAC TROMETHAMINE 30 MG/ML IJ SOLN: 15.0000 mg | Freq: Once | INTRAMUSCULAR | Status: DC | PRN

## 2013-09-20 MED ORDER — MIDAZOLAM HCL 2 MG/2ML IJ SOLN
INTRAMUSCULAR | Status: DC | PRN
  Administered 2013-09-20: 2 mg via INTRAVENOUS

## 2013-09-20 MED ORDER — BUPIVACAINE-EPINEPHRINE (PF) 0.5% -1:200000 IJ SOLN
INTRAMUSCULAR | Status: AC
  Filled 2013-09-20: qty 30

## 2013-09-20 MED ORDER — PROMETHAZINE HCL 25 MG/ML IJ SOLN: 6.2500 mg | INTRAMUSCULAR | Status: DC | PRN

## 2013-09-20 MED ORDER — NEOSTIGMINE METHYLSULFATE 10 MG/10ML IV SOLN
INTRAVENOUS | Status: DC | PRN
  Administered 2013-09-20: 3 mg via INTRAVENOUS

## 2013-09-20 MED ORDER — ROCURONIUM BROMIDE 100 MG/10ML IV SOLN
INTRAVENOUS | Status: AC
  Filled 2013-09-20: qty 1

## 2013-09-20 MED ORDER — FENTANYL CITRATE 0.05 MG/ML IJ SOLN
INTRAMUSCULAR | Status: AC
  Filled 2013-09-20: qty 5

## 2013-09-20 MED ORDER — DEXAMETHASONE SODIUM PHOSPHATE 10 MG/ML IJ SOLN
INTRAMUSCULAR | Status: AC
  Filled 2013-09-20: qty 1

## 2013-09-20 MED ORDER — ROCURONIUM BROMIDE 100 MG/10ML IV SOLN
INTRAVENOUS | Status: DC | PRN
  Administered 2013-09-20: 30 mg via INTRAVENOUS

## 2013-09-20 MED ORDER — KETOROLAC TROMETHAMINE 30 MG/ML IJ SOLN
INTRAMUSCULAR | Status: AC
  Filled 2013-09-20: qty 2

## 2013-09-20 MED ORDER — LIDOCAINE HCL (CARDIAC) 20 MG/ML IV SOLN
INTRAVENOUS | Status: AC
  Filled 2013-09-20: qty 5

## 2013-09-20 MED ORDER — GLYCOPYRROLATE 0.2 MG/ML IJ SOLN
INTRAMUSCULAR | Status: DC | PRN
  Administered 2013-09-20: 0.6 mg via INTRAVENOUS

## 2013-09-20 MED ORDER — OXYCODONE-ACETAMINOPHEN 5-325 MG PO TABS: 1.0000 | ORAL_TABLET | ORAL | Status: DC | PRN

## 2013-09-20 MED ORDER — MIDAZOLAM HCL 2 MG/2ML IJ SOLN: 0.5000 mg | Freq: Once | INTRAMUSCULAR | Status: DC | PRN

## 2013-09-20 MED ORDER — LACTATED RINGERS IV SOLN
INTRAVENOUS | Status: DC
  Administered 2013-09-20: 11:00:00 via INTRAVENOUS

## 2013-09-20 MED ORDER — FENTANYL CITRATE 0.05 MG/ML IJ SOLN
INTRAMUSCULAR | Status: DC | PRN
  Administered 2013-09-20: 50 ug via INTRAVENOUS
  Administered 2013-09-20: 100 ug via INTRAVENOUS

## 2013-09-20 MED ORDER — ONDANSETRON HCL 4 MG/2ML IJ SOLN
INTRAMUSCULAR | Status: AC
  Filled 2013-09-20: qty 2

## 2013-09-20 MED ORDER — IBUPROFEN 800 MG PO TABS: 800.0000 mg | ORAL_TABLET | Freq: Three times a day (TID) | ORAL | Status: DC | PRN

## 2013-09-20 MED ORDER — ONDANSETRON HCL 4 MG/2ML IJ SOLN
INTRAMUSCULAR | Status: DC | PRN
  Administered 2013-09-20: 4 mg via INTRAVENOUS

## 2013-09-20 MED ORDER — FENTANYL CITRATE 0.05 MG/ML IJ SOLN
INTRAMUSCULAR | Status: AC
  Filled 2013-09-20: qty 2

## 2013-09-20 MED ORDER — BUPIVACAINE-EPINEPHRINE 0.5% -1:200000 IJ SOLN
INTRAMUSCULAR | Status: DC | PRN
Start: 2013-09-20 — End: 2013-09-20
  Administered 2013-09-20: 5 mL

## 2013-09-20 MED ORDER — LIDOCAINE HCL (CARDIAC) 20 MG/ML IV SOLN
INTRAVENOUS | Status: DC | PRN
  Administered 2013-09-20: 50 mg via INTRAVENOUS

## 2013-09-20 MED ORDER — DEXAMETHASONE SODIUM PHOSPHATE 10 MG/ML IJ SOLN
INTRAMUSCULAR | Status: DC | PRN
  Administered 2013-09-20: 10 mg via INTRAVENOUS

## 2013-09-20 MED ORDER — MIDAZOLAM HCL 2 MG/2ML IJ SOLN
INTRAMUSCULAR | Status: AC
  Filled 2013-09-20: qty 2

## 2013-09-20 MED ORDER — FENTANYL CITRATE 0.05 MG/ML IJ SOLN: 25.0000 ug | INTRAMUSCULAR | Status: DC | PRN

## 2013-09-20 SURGICAL SUPPLY — 14 items
CATH ROBINSON RED A/P 16FR (CATHETERS) ×3 IMPLANT
CHLORAPREP W/TINT 26ML (MISCELLANEOUS) ×3 IMPLANT
CLOTH BEACON ORANGE TIMEOUT ST (SAFETY) ×3 IMPLANT
DRSG OPSITE 4X5.5 SM (GAUZE/BANDAGES/DRESSINGS) ×2 IMPLANT
GLOVE BIOGEL PI IND STRL 8.5 (GLOVE) ×1 IMPLANT
GLOVE BIOGEL PI INDICATOR 8.5 (GLOVE) ×2
GLOVE ECLIPSE 8.0 STRL XLNG CF (GLOVE) ×6 IMPLANT
GOWN STRL REUS W/TWL 2XL LVL3 (GOWN DISPOSABLE) ×3 IMPLANT
GOWN STRL REUS W/TWL LRG LVL3 (GOWN DISPOSABLE) ×6 IMPLANT
PACK LAPAROSCOPY BASIN (CUSTOM PROCEDURE TRAY) ×3 IMPLANT
SUT MNCRL AB 4-0 PS2 18 (SUTURE) ×3 IMPLANT
SUT VICRYL 0 UR6 27IN ABS (SUTURE) ×3 IMPLANT
TOWEL OR 17X24 6PK STRL BLUE (TOWEL DISPOSABLE) ×6 IMPLANT
WATER STERILE IRR 1000ML POUR (IV SOLUTION) ×3 IMPLANT

## 2013-09-20 NOTE — Anesthesia Preprocedure Evaluation (Addendum)
Anesthesia Evaluation  Patient identified by MRN, date of birth, ID band Patient awake    Reviewed: Allergy & Precautions, H&P , Patient's Chart, lab work & pertinent test results, reviewed documented beta blocker date and time   History of Anesthesia Complications Negative for: history of anesthetic complications  Airway Mallampati: II  TM Distance: >3 FB Neck ROM: full    Dental   Pulmonary  breath sounds clear to auscultation        Cardiovascular Exercise Tolerance: Good Rhythm:regular Rate:Normal     Neuro/Psych    GI/Hepatic   Endo/Other    Renal/GU      Musculoskeletal   Abdominal   Peds  Hematology   Anesthesia Other Findings   Reproductive/Obstetrics                             Anesthesia Physical Anesthesia Plan  ASA: II  Anesthesia Plan: General ETT   Post-op Pain Management:    Induction:   Airway Management Planned:   Additional Equipment:   Intra-op Plan:   Post-operative Plan:   Informed Consent: I have reviewed the patients History and Physical, chart, labs and discussed the procedure including the risks, benefits and alternatives for the proposed anesthesia with the patient or authorized representative who has indicated his/her understanding and acceptance.   Dental Advisory Given  Plan Discussed with: CRNA and Surgeon  Anesthesia Plan Comments:         Anesthesia Quick Evaluation  

## 2013-09-20 NOTE — Op Note (Signed)
OPERATIVE NOTE  Jeanne Stark  DOB:    [birth year]  MRN:    [id number]  CSN:    [id number]  Date of Surgery:  09/20/2013  Preoperative Diagnosis:  Desires sterilization  Postoperative Diagnosis:  Desires sterilization   Procedure:  Laparoscopic Tubal Cautery  Surgeon:  Leonard SchwartzArthur Vernon Sharrod Achille, M.D.  Assistant:  None  Anesthetic:  General  Disposition:  Ms. Jeanne Stark is a 23 y.o. year old female,G2P2003, who presents for a tubal sterilization procedure. She understands the indications for her operation as well as the alternative treatment options. She accepts the risk of, but not limited to, anesthetic complications, bleeding, infections, possible damage to the surrounding organs, and possible tubal failure (17 per 1000).  Findings:  The uterus, fallopian tubes, and ovaries were normal. The bowel and the liver appeared normal.  Procedure:  The patient was taken to the operating room where a general anesthetic was given. The patient's abdomen, perineum, and vagina were prepped with sterile solution. The bladder was drained of urine. An examination under anesthesia was performed. A Hulka tenaculum was placed inside the uterus. The patient was sterilely draped. The subumbilical area was injected with half percent Marcaine with epinephrine. A subumbilical incision was made and the Veress needle was inserted into the abdominal cavity without difficulty. Proper placement was confirmed using the fluid drop test. A pneumoperitoneum was obtained. The laparoscopic trocar and the laparoscope were substituted for the Veress needle. The pelvic organs were inspected with findings as mentioned above. The right fallopian tube was identified and followed to the fimbriated end. The right fallopian tube was cauterized in its proximal portion in several areas until the tube was completely desiccated. Hemostasis was adequate. An identical procedure was carried out on the opposite side.  Again hemostasis was adequate. The pneumoperitoneum was allowed to escape. All instruments were removed. The subumbilical fascia was closed using 0 Vicryl. The skin was reapproximated using 3-0 Monocryl. Bleeding was noted from the cervix when the tenaculum was removed. Hemostasis was achieved with a stitch of 0 vicryl. Sponge and needle counts were correct. The estimated blood loss was less than 2 cc. The patient tolerated her procedure well. She was awakened from her anesthetic without difficulty. She was transported to the recovery room in stable condition.  Followup instructions:  The patient will return to see Dr. Stefano GaulStringer in 2 weeks. She was given a copy of the postoperative instructions [facility] for patients who've undergone laparoscopy.  Discharge medications:  Motrin 800 mg tablets            One tablet every 8 hours as needed for moderate pain. Vicodin 5/500 tablets             One or two tablets every 4 hours as needed for severe pain.  Leonard SchwartzArthur Vernon Gizella [place], M.D.

## 2013-09-20 NOTE — H&P (Signed)
Admission History and Physical Exam for a Gynecology Patient  Jeanne Stark is a 23 y.o. female, G2P2003, who presents for laparoscopic tubal cautery. She desires sterilization. [facility].  OB History   Grav Para Term Preterm Abortions TAB SAB Ect Mult Living   2 2 2  0 0 0 0 0 1 3      Past Medical History  Diagnosis Date  . Gall stones   . Herpes   . Hx of carpal tunnel syndrome   . Preterm labor     No prescriptions prior to admission    Past Surgical History  Procedure Laterality Date  . Wisdom tooth extraction    . No past surgeries    . Cesarean section N/A 07/24/2013    Procedure: Emergent CESAREAN SECTION, repair of vaginal laceration;  Surgeon: Geryl RankinsEvelyn Varnado, MD;  Location: [facility];  Service: Obstetrics;  Laterality: N/A;    No Known Allergies  Family History: family history includes COPD in her maternal aunt and maternal grandmother; Hyperlipidemia in her mother.  Social History:  reports that she has never smoked. She has never used smokeless tobacco. She reports that she drinks about .6 ounces of alcohol per week. She reports that she does not use illicit drugs.  Review of systems: See HPI.  Admission Physical Exam:    Body mass index is 22.31 kg/(m^2).  Blood pressure 113/55, pulse 90, temperature 98.2 F (36.8 C), temperature source Oral, resp. rate 18, height 5\' 2"  (1.575 m), weight 122 lb (55.339 kg), SpO2 100.00%, unknown if currently breastfeeding.  HEENT:                 Within normal limits Chest:                   Clear Heart:                    Regular rate and rhythm Breasts:                No masses, skin changes, bleeding, or discharge present Abdomen:             Nontender, no masses Extremities:          Grossly normal Neurologic exam: Grossly normal  Pelvic exam:  External genitalia: normal general appearance Vaginal: normal  without tenderness, induration or masses Cervix: normal appearance Adnexa: normal bimanual exam Uterus: Normal size shape and consistency  Assessment:  Desires sterilization  Plan:  The patient understands the indications for her procedure. She accepts the risk of, but not limited to, anesthetic complications, bleeding, infections, possible damage to the surrounding organs, and possible tubal failure (10-17 per 1000).  The patient was interviewed and examined today.  The previously documented history and physical examination was reviewed. There are no changes. The operative procedure was reviewed. The risks and benefits were outlined again. The specific risks include, but are not limited to, anesthetic complications, bleeding, infections, and possible damage to the surrounding organs. The patient's questions were answered.  We are ready to proceed as outlined. The likelihood of the patient achieving the goals of this procedure is very likely.    Kirkland Hunrthur Daviona Herbert 09/20/2013

## 2013-09-20 NOTE — Transfer of Care (Signed)
Immediate Anesthesia Transfer of Care Note  Patient: [name]  Procedure(s) Performed: Procedure(s): LAPAROSCOPIC TUBAL LIGATION (Bilateral)  Patient Location: PACU  Anesthesia Type:General  Level of Consciousness: awake, alert  and oriented  Airway & Oxygen Therapy: Patient Spontanous Breathing and Patient connected to nasal cannula oxygen  Post-op Assessment: Report given to PACU RN and Post -op Vital signs reviewed and stable  Post vital signs: Reviewed and stable  Complications: No apparent anesthesia complications

## 2013-09-20 NOTE — Discharge Instructions (Signed)
Diagnostic Laparoscopy  Laparoscopy is a surgical procedure. It is used to diagnose and treat diseases inside the belly (abdomen). It is usually a brief, common, and relatively simple procedure. The laparoscopeis a thin, lighted, pencil-sized instrument. It is like a telescope. It is inserted into your abdomen through a small cut (incision). Your caregiver can look at the organs inside your body through this instrument. He or she can see if there is anything abnormal.  Laparoscopy can be done either in a hospital or outpatient clinic. You may be given a mild sedative to help you relax before the procedure. Once in the operating room, you will be given a drug to make you sleep (general anesthesia). Laparoscopy usually lasts less than 1 hour. After the procedure, you will be monitored in a recovery area until you are stable and doing well. Once you are home, it will take 2 to 3 days to fully recover.  RISKS AND COMPLICATIONS   Laparoscopy has relatively few risks. Your caregiver will discuss the risks with you before the procedure.  Some problems that can occur include:  · Infection.  · Bleeding.  · Damage to other organs.  · Anesthetic side effects.  PROCEDURE  Once you receive anesthesia, your surgeon inflates the abdomen with a harmless gas (carbon dioxide). This makes the organs easier to see. The laparoscope is inserted into the abdomen through a small incision. This allows your surgeon to see into the abdomen. Other small instruments are also inserted into the abdomen through other small openings. Many surgeons attach a video camera to the laparoscope to enlarge the view.  During a diagnostic laparoscopy, the surgeon may be looking for inflammation, infection, or cancer. Your surgeon may take tissue samples(biopsies). The samples are sent to a specialist in looking at cells and tissue samples (pathologist). The pathologist examines them under a microscope. Biopsies can help to diagnose or confirm a  disease.  AFTER THE PROCEDURE   · The gas is released from inside the abdomen.  · The incisions are closed with stitches (sutures). Because these incisions are small (usually less than 1/2 inch), there is usually minimal discomfort after the procedure. There may be some mild discomfort in the throat. This is from the tube placed in the throat while you were sleeping. You may have some mild abdominal discomfort. There may also be discomfort from the instrument placement incisions in the abdomen.  · The recovery time is shortened as long as there are no complications.  · You will rest in a recovery room until stable and doing well. As long as there are no complications, you may be allowed to go home.  FINDING OUT THE RESULTS OF YOUR TEST  Not all test results are available during your visit. If your test results are not back during the visit, make an appointment with your caregiver to find out the results. Do not assume everything is normal if you have not heard from your caregiver or the medical facility. It is important for you to follow up on all of your test results.  HOME CARE INSTRUCTIONS   · Take all medicines as directed.  · Only take over-the-counter or prescription medicines for pain, discomfort, or fever as directed by your caregiver.  · Resume daily activities as directed.  · Showers are preferred over baths.  · You may resume sexual activities in 1 week or as directed.  · Do not drive while taking narcotics.  SEEK MEDICAL CARE IF:   · There is   increasing abdominal pain.  · There is new pain in the shoulders (shoulder strap areas).  · You feel lightheaded or faint.  · You have the chills.  · You or your child has an oral temperature above 102° F (38.9° C).  · There is pus-like (purulent) drainage from any of the wounds.  · You are unable to pass gas or have a bowel movement.  · You feel sick to your stomach (nauseous) or throw up (vomit).  MAKE SURE YOU:   · Understand these instructions.  · Will watch  your condition.  · Will get help right away if you are not doing well or get worse.  Document Released: 07/29/2000 Document Revised: 08/17/2012 Document Reviewed: 04/22/2007  Exit[employer]® Patient Information ©2014 [employer].

## 2013-09-20 NOTE — Anesthesia Procedure Notes (Signed)
Procedure Name: Intubation Date/Time: 09/20/2013 12:31 PM Performed by: Shanon PayorGREGORY, Geneen Dieter M Pre-anesthesia Checklist: Patient being monitored, Suction available, Emergency Drugs available, Patient identified and Timeout performed Patient Re-evaluated:Patient Re-evaluated prior to inductionOxygen Delivery Method: Circle system utilized Preoxygenation: Pre-oxygenation with 100% oxygen Intubation Type: IV induction Ventilation: Mask ventilation without difficulty Laryngoscope Size: Mac and 3 Grade View: Grade I Tube type: Oral Tube size: 7.0 mm Number of attempts: 1 Airway Equipment and Method: Stylet Secured at: 22 cm Tube secured with: Tape Dental Injury: Teeth and Oropharynx as per pre-operative assessment

## 2013-09-20 NOTE — Anesthesia Postprocedure Evaluation (Signed)
  Anesthesia Post-op Note  Anesthesia Post Note  Patient: [name]  Procedure(s) Performed: Procedure(s) (LRB): LAPAROSCOPIC TUBAL LIGATION (Bilateral)  Anesthesia type: General  Patient location: PACU  Post pain: Pain level controlled  Post assessment: Post-op Vital signs reviewed  Last Vitals:  Filed Vitals:   09/20/13 1430  BP:   Pulse:   Temp: 36.7 C  Resp: 16    Post vital signs: Reviewed  Level of consciousness: sedated  Complications: No apparent anesthesia complications

## 2013-09-20 NOTE — Progress Notes (Signed)
Charted in error-teaching done at 1530

## 2013-09-21 ENCOUNTER — Encounter (HOSPITAL_COMMUNITY): Payer: Self-pay | Admitting: Obstetrics and Gynecology

## 2014-03-07 ENCOUNTER — Encounter (HOSPITAL_COMMUNITY): Payer: Self-pay | Admitting: Obstetrics and Gynecology

## 2014-09-28 ENCOUNTER — Encounter: Payer: Medicaid Other | Admitting: Obstetrics & Gynecology

## 2014-10-05 ENCOUNTER — Ambulatory Visit (INDEPENDENT_AMBULATORY_CARE_PROVIDER_SITE_OTHER): Payer: Medicaid Other | Admitting: Obstetrics & Gynecology

## 2014-10-05 ENCOUNTER — Encounter: Payer: Self-pay | Admitting: Obstetrics & Gynecology

## 2014-10-05 VITALS — BP 108/72 | HR 86 | Wt 121.0 lb

## 2014-10-05 DIAGNOSIS — Z7251 High risk heterosexual behavior: Secondary | ICD-10-CM | POA: Diagnosis not present

## 2014-10-05 DIAGNOSIS — N898 Other specified noninflammatory disorders of vagina: Secondary | ICD-10-CM

## 2014-10-05 NOTE — Progress Notes (Signed)
   Subjective:    Patient ID: Jeanne Stark, female    DOB: [birth year], 24 y.o.   MRN: [id number]  HPI  24 yo S AA P3 here with a somewhat odorous thickish whitish vaginal discharge. She has had several occasions of BV in the past. She has multiple sexual partners (5 in the last few weeks) and doesn't use condoms regularly.  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  Review of Systems     Objective:   Physical Exam [id number] Pleasant Abd- benign Vaginal exam normal, discharge c/w BV       Assessment & Plan:  Probable BV- treat with flagyl (no etoh) Discussed safe sex Schedule appt with psychiatrist regarding her addiction

## 2014-10-06 ENCOUNTER — Telehealth: Payer: Self-pay | Admitting: *Deleted

## 2014-10-06 DIAGNOSIS — N760 Acute vaginitis: Principal | ICD-10-CM

## 2014-10-06 DIAGNOSIS — B9689 Other specified bacterial agents as the cause of diseases classified elsewhere: Secondary | ICD-10-CM

## 2014-10-06 LAB — HIV ANTIBODY (ROUTINE TESTING W REFLEX): HIV 1&2 Ab, 4th Generation: NONREACTIVE

## 2014-10-06 LAB — HEPATITIS C ANTIBODY: HCV Ab: NEGATIVE

## 2014-10-06 LAB — RPR

## 2014-10-06 LAB — WET PREP, GENITAL
TRICH WET PREP: NONE SEEN
Yeast Wet Prep HPF POC: NONE SEEN

## 2014-10-06 LAB — GC/CHLAMYDIA PROBE AMP
CT Probe RNA: NEGATIVE
GC PROBE AMP APTIMA: NEGATIVE

## 2014-10-06 LAB — HEPATITIS B SURFACE ANTIGEN: HEP B S AG: NEGATIVE

## 2014-10-06 MED ORDER — METRONIDAZOLE 500 MG PO TABS: 500.0000 mg | ORAL_TABLET | Freq: Two times a day (BID) | ORAL | Status: DC

## 2014-10-06 NOTE — Telephone Encounter (Signed)
Patient called and said her prescription was not sent in yesterday.  I sent in Flagyl for her per Dr. Marice Potterove for bacterial vaginosis.

## 2014-11-08 ENCOUNTER — Ambulatory Visit (INDEPENDENT_AMBULATORY_CARE_PROVIDER_SITE_OTHER): Payer: Medicaid Other | Admitting: Family Medicine

## 2014-11-08 ENCOUNTER — Other Ambulatory Visit: Payer: Self-pay | Admitting: Family Medicine

## 2014-11-08 ENCOUNTER — Encounter: Payer: Self-pay | Admitting: Family Medicine

## 2014-11-08 VITALS — BP 117/76 | HR 99 | Ht 62.0 in | Wt 122.0 lb

## 2014-11-08 DIAGNOSIS — N898 Other specified noninflammatory disorders of vagina: Secondary | ICD-10-CM | POA: Diagnosis not present

## 2014-11-08 DIAGNOSIS — N760 Acute vaginitis: Secondary | ICD-10-CM

## 2014-11-08 DIAGNOSIS — Z113 Encounter for screening for infections with a predominantly sexual mode of transmission: Secondary | ICD-10-CM | POA: Diagnosis not present

## 2014-11-08 MED ORDER — BORIC ACID CRYS: 1.0000 | CRYSTALS | Freq: Every day | Status: DC

## 2014-11-08 MED ORDER — METRONIDAZOLE 500 MG PO TABS: 500.0000 mg | ORAL_TABLET | Freq: Two times a day (BID) | ORAL | Status: DC

## 2014-11-08 NOTE — Assessment & Plan Note (Signed)
Given recurrent nature, begin nightly boric acid. May switch to 2-3x/wk if controls symptoms.  Round of Flagyl begins today.

## 2014-11-08 NOTE — Progress Notes (Signed)
Having vaginal discharge with odor.  Wants STI testing.

## 2014-11-08 NOTE — Patient Instructions (Signed)

## 2014-11-08 NOTE — Progress Notes (Signed)
    Subjective:    Patient ID: Jeanne Stark is a 24 y.o. female presenting with Gynecologic Exam  on 11/08/2014  HPI: Reports thickened white discharge.  Odor that is fishy and is foul smelling. Denies itching, burning. No dysuria.  No N/V, no fever, chills.  Some abdominal cramping.  Review of Systems  Constitutional: Negative for fever and chills.  Respiratory: Negative for shortness of breath.   Cardiovascular: Negative for chest pain.  Gastrointestinal: Positive for abdominal pain. Negative for nausea and vomiting.  Genitourinary: Negative for dysuria.  Skin: Negative for rash.      Objective:    BP 117/76 mmHg  Pulse 99  Ht 5\' 2"  (1.575 m)  Wt 122 lb (55.339 kg)  BMI 22.31 kg/m2  LMP 10/21/2014  Breastfeeding? No Physical Exam  Constitutional: She is oriented to person, place, and time. She appears well-developed and well-nourished. No distress.  HENT:  Head: Normocephalic and atraumatic.  Eyes: No scleral icterus.  Neck: Neck supple.  Cardiovascular: Normal rate.   Pulmonary/Chest: Effort normal.  Abdominal: Soft.  Neurological: She is alert and oriented to person, place, and time.  Skin: Skin is warm and dry.  Psychiatric: She has a normal mood and affect.        Assessment & Plan:   Problem List Items Addressed This Visit      Unprioritized   Recurrent vaginitis    Given recurrent nature, begin nightly boric acid. May switch to 2-3x/wk if controls symptoms.  Round of Flagyl begins today.      Relevant Medications   Boric Acid CRYS   metroNIDAZOLE (FLAGYL) 500 MG tablet    Other Visit Diagnoses    Screen for STD (sexually transmitted disease)    -  Primary    Relevant Orders    Wet prep, genital    GC/Chlamydia Probe Amp    HIV antibody    RPR    Vaginal discharge        Relevant Orders    Wet prep, genital    GC/Chlamydia Probe Amp       Return if symptoms worsen or fail to improve.  PRATT,TANYA S 11/08/2014 4:08 PM

## 2014-11-09 LAB — GC/CHLAMYDIA PROBE AMP
CT PROBE, AMP APTIMA: NEGATIVE
GC Probe RNA: NEGATIVE

## 2014-11-09 LAB — HIV ANTIBODY (ROUTINE TESTING W REFLEX): HIV 1&2 Ab, 4th Generation: NONREACTIVE

## 2014-11-09 LAB — WET PREP, GENITAL
Trich, Wet Prep: NONE SEEN
Yeast Wet Prep HPF POC: NONE SEEN

## 2014-11-09 LAB — RPR

## 2014-11-22 ENCOUNTER — Telehealth: Payer: Self-pay | Admitting: *Deleted

## 2014-11-22 NOTE — Telephone Encounter (Signed)
Pharmacy called for clarification on Boric Acid Script.

## 2015-03-09 ENCOUNTER — Ambulatory Visit: Payer: Medicaid Other | Admitting: Obstetrics & Gynecology

## 2015-05-17 ENCOUNTER — Other Ambulatory Visit (HOSPITAL_COMMUNITY)
Admission: RE | Admit: 2015-05-17 | Discharge: 2015-05-17 | Disposition: A | Discharge status: Home / Self Care | Payer: Medicaid Other | Source: Ambulatory Visit | Attending: Certified Nurse Midwife | Admitting: Certified Nurse Midwife

## 2015-05-17 ENCOUNTER — Encounter: Payer: Self-pay | Admitting: Certified Nurse Midwife

## 2015-05-17 ENCOUNTER — Ambulatory Visit (INDEPENDENT_AMBULATORY_CARE_PROVIDER_SITE_OTHER): Payer: Medicaid Other | Admitting: Certified Nurse Midwife

## 2015-05-17 VITALS — BP 107/72 | HR 89 | Resp 18 | Ht 62.0 in | Wt 129.0 lb

## 2015-05-17 DIAGNOSIS — N898 Other specified noninflammatory disorders of vagina: Secondary | ICD-10-CM

## 2015-05-17 DIAGNOSIS — Z113 Encounter for screening for infections with a predominantly sexual mode of transmission: Secondary | ICD-10-CM | POA: Diagnosis not present

## 2015-05-17 NOTE — Addendum Note (Signed)
Addended by: Tandy GawHINTON, Murl Zogg C on: 05/17/2015 03:33 PM   Modules accepted: Orders

## 2015-05-17 NOTE — Progress Notes (Signed)
Patient ID: Jeanne Stark, female   DOB: 08-26-1990, 25 y.o.   MRN: [id number] CC: Vaginal Discharge    None     HPI Jeanne Stark is a 25 y.o. [phone]  who presents with onset of clear vaginal discharge, sometimes white for approx 2 weeks. Reports odor. Has new sex partner   Past Medical History  Diagnosis Date  . Gall stones   . Herpes   . Hx of carpal tunnel syndrome   . Preterm labor     OB History  Gravida Para Term Preterm AB SAB TAB Ectopic Multiple Living  2 2 2  0 0 0 0 0 1 3    # Outcome Date GA Lbr Len/2nd Weight Sex Delivery Anes PTL Lv  2A Term 07/24/13 621w5d / 00:07 5 lb 5.9 oz (2.435 kg) F Vag-Spont None  Y  2B Term 07/24/13 331w5d / 01:15 5 lb 9.1 oz (2.525 kg) F CS-LTranv Gen  Y  1 Term 06/27/08   6 lb 10 oz (3.005 kg) F Vag-Spont None N Y      Past Surgical History  Procedure Laterality Date  . Wisdom tooth extraction    . No past surgeries    . Cesarean section N/A 07/24/2013    Procedure: Emergent CESAREAN SECTION, repair of vaginal laceration;  Surgeon: Geryl RankinsEvelyn Varnado, MD;  Location: [facility];  Service: Obstetrics;  Laterality: N/A;  . Laparoscopic tubal ligation Bilateral 09/20/2013    Procedure: LAPAROSCOPIC TUBAL LIGATION;  Surgeon: Kirkland HunArthur Stringer, MD;  Location: [facility];  Service: Gynecology;  Laterality: Bilateral;    Social History   Social History  . Marital Status: Single    Spouse Name: N/A  . Number of Children: N/A  . Years of Education: N/A   Occupational History  . Not on file.   Social History Main Topics  . Smoking status: Never Smoker   . Smokeless tobacco: Never Used  . Alcohol Use: 0.6 oz/week    1 Shots of liquor per week     Comment: occasional  . Drug Use: No  . Sexual Activity: Yes    Birth Control/ Protection: None, Surgical   Other Topics Concern  . Not on file   Social History Narrative    No current outpatient prescriptions on file prior to visit.   No current facility-administered medications on file  prior to visit.    No Known Allergies   ROS Pertinent items in HPI   PHYSICAL EXAM Filed Vitals:   05/17/15 1518  BP: 107/72  Pulse: 89  Resp: 18   General: Well nourished, well developed female in no acute distress Cardiovascular: Normal rate Respiratory: Normal effort Abdomen: Soft, nontender Back: No CVAT Extremities: No edema Neurologic:grossly intact Speculum exam: NEFG; vagina with moderate amount clear discharge, no blood; cervix clean Bimanual exam: cervix closed, no CMT; uterus NSSP; no adnexal tenderness or masses     ASSESSMENT Vaginal Discharge  No diagnosis found.  PLAN Gc/Ch Cx's Wet prep Treat appropriatley when resulted     Medication List    Notice  As of 05/17/2015  3:24 PM   You have not been prescribed any medications.        Rhea PinkLori A Clemmons, CNM 05/17/2015 3:24 PM

## 2015-05-17 NOTE — Patient Instructions (Signed)

## 2015-05-18 LAB — GC/CHLAMYDIA PROBE AMP (~~LOC~~) NOT AT ARMC
Chlamydia: NEGATIVE
Neisseria Gonorrhea: NEGATIVE

## 2015-05-18 LAB — WET PREP, GENITAL
Trich, Wet Prep: NONE SEEN
Yeast Wet Prep HPF POC: NONE SEEN

## 2015-05-19 ENCOUNTER — Telehealth: Payer: Self-pay | Admitting: *Deleted

## 2015-05-19 DIAGNOSIS — B9689 Other specified bacterial agents as the cause of diseases classified elsewhere: Secondary | ICD-10-CM

## 2015-05-19 DIAGNOSIS — N760 Acute vaginitis: Principal | ICD-10-CM

## 2015-05-19 MED ORDER — METRONIDAZOLE 500 MG PO TABS: 500.0000 mg | ORAL_TABLET | Freq: Two times a day (BID) | ORAL | Status: DC

## 2015-05-19 NOTE — Telephone Encounter (Signed)
-----   Message from Olevia BowensJacinda S Battle sent at 05/19/2015  9:01 AM EST ----- Regarding: Lab Results Contact: [phone] Wants to know lab results from visit

## 2015-05-19 NOTE — Telephone Encounter (Signed)
Wet prep showed clue cells which indicates bacterial vaginosis and patient is still having vaginal discharge so I will call in Flagyl for patient.  Patient aware.

## 2015-10-10 ENCOUNTER — Ambulatory Visit (INDEPENDENT_AMBULATORY_CARE_PROVIDER_SITE_OTHER): Payer: Self-pay | Admitting: Family Medicine

## 2015-10-10 ENCOUNTER — Other Ambulatory Visit (HOSPITAL_COMMUNITY)
Admission: RE | Admit: 2015-10-10 | Discharge: 2015-10-10 | Disposition: A | Discharge status: Home / Self Care | Payer: Medicaid Other | Source: Ambulatory Visit | Attending: Family Medicine | Admitting: Family Medicine

## 2015-10-10 ENCOUNTER — Encounter: Payer: Self-pay | Admitting: Family Medicine

## 2015-10-10 VITALS — BP 101/64 | HR 88 | Resp 18 | Ht 62.0 in | Wt 120.0 lb

## 2015-10-10 DIAGNOSIS — Z01419 Encounter for gynecological examination (general) (routine) without abnormal findings: Secondary | ICD-10-CM | POA: Insufficient documentation

## 2015-10-10 DIAGNOSIS — N898 Other specified noninflammatory disorders of vagina: Secondary | ICD-10-CM

## 2015-10-10 DIAGNOSIS — Z113 Encounter for screening for infections with a predominantly sexual mode of transmission: Secondary | ICD-10-CM | POA: Insufficient documentation

## 2015-10-10 DIAGNOSIS — Z124 Encounter for screening for malignant neoplasm of cervix: Secondary | ICD-10-CM

## 2015-10-10 MED ORDER — FLUCONAZOLE 150 MG PO TABS: 150.0000 mg | ORAL_TABLET | Freq: Once | ORAL | Status: DC

## 2015-10-10 NOTE — Progress Notes (Signed)
Pt here today c/o vaginal discharge and one episode of left lower pelvic pain yesterday.  Pt has had a new sexual partner and would like to be screened for STD.

## 2015-10-10 NOTE — Progress Notes (Signed)
   Subjective:    Patient ID: Jeanne Stark is a 25 y.o. female presenting with Vaginal Discharge  on 10/10/2015  HPI: Has LMP 5/29, some pain. Went off then had come back and notes left sided pain. New sexual partner. Pain has gone away after a few hours. Had HSV outbreak at that time.  Review of Systems  Constitutional: Negative for fever and chills.  Respiratory: Negative for shortness of breath.   Cardiovascular: Negative for chest pain.  Gastrointestinal: Negative for nausea, vomiting and abdominal pain.  Genitourinary: Negative for dysuria.  Skin: Negative for rash.      Objective:    BP 101/64 mmHg  Pulse 88  Resp 18  Ht 5\' 2"  (1.575 m)  Wt 120 lb (54.432 kg)  BMI 21.94 kg/m2  LMP 10/01/2015 Physical Exam  Constitutional: She is oriented to person, place, and time. She appears well-developed and well-nourished. No distress.  HENT:  Head: Normocephalic and atraumatic.  Eyes: No scleral icterus.  Neck: Neck supple.  Cardiovascular: Normal rate.   Pulmonary/Chest: Effort normal.  Abdominal: Soft.  Genitourinary:  BUS normal, vagina is pink and rugated, adherent, discharge noted, cervix is nulliparous without lesion, no CMT, uterus is small and anteverted, no adnexal mass or tenderness.   Neurological: She is alert and oriented to person, place, and time.  Skin: Skin is warm and dry.  Psychiatric: She has a normal mood and affect.        Assessment & Plan:   Problem List Items Addressed This Visit    None    Visit Diagnoses    Vaginal discharge    -  Primary    Relevant Medications    fluconazole (DIFLUCAN) 150 MG tablet    Other Relevant Orders    Wet prep, genital    Screening for cervical cancer        Relevant Orders    Cytology - PAP      Check GC/Chlam with pap  Return if symptoms worsen or fail to improve.  Esiah Bazinet S 10/10/2015 11:41 AM

## 2015-10-10 NOTE — Patient Instructions (Signed)

## 2015-10-11 ENCOUNTER — Telehealth: Payer: Self-pay | Admitting: *Deleted

## 2015-10-11 DIAGNOSIS — N760 Acute vaginitis: Principal | ICD-10-CM

## 2015-10-11 DIAGNOSIS — B9689 Other specified bacterial agents as the cause of diseases classified elsewhere: Secondary | ICD-10-CM

## 2015-10-11 LAB — WET PREP, GENITAL
Trich, Wet Prep: NONE SEEN
Yeast Wet Prep HPF POC: NONE SEEN

## 2015-10-11 LAB — CYTOLOGY - PAP

## 2015-10-11 MED ORDER — METRONIDAZOLE 500 MG PO TABS: 500.0000 mg | ORAL_TABLET | Freq: Two times a day (BID) | ORAL | Status: DC

## 2015-10-11 NOTE — Telephone Encounter (Signed)
-----   Message from Reva Boresanya S Pratt, MD sent at 10/11/2015 10:33 AM EDT ----- Looks like she also has BV--can send in Flagyl 500 mg po bid

## 2015-10-11 NOTE — Telephone Encounter (Signed)
Informed pt of result and instructed pt on use of Flagyl.  Sent medication to the pharmacy.

## 2015-10-13 ENCOUNTER — Emergency Department (HOSPITAL_COMMUNITY)
Admission: EM | Admit: 2015-10-13 | Discharge: 2015-10-13 | Disposition: A | Discharge status: Home / Self Care | Payer: Medicaid Other | Attending: Emergency Medicine | Admitting: Emergency Medicine

## 2015-10-13 ENCOUNTER — Encounter (HOSPITAL_COMMUNITY): Payer: Self-pay | Admitting: *Deleted

## 2015-10-13 DIAGNOSIS — S60464A Insect bite (nonvenomous) of right ring finger, initial encounter: Secondary | ICD-10-CM | POA: Insufficient documentation

## 2015-10-13 DIAGNOSIS — Y999 Unspecified external cause status: Secondary | ICD-10-CM | POA: Insufficient documentation

## 2015-10-13 DIAGNOSIS — W57XXXA Bitten or stung by nonvenomous insect and other nonvenomous arthropods, initial encounter: Secondary | ICD-10-CM | POA: Insufficient documentation

## 2015-10-13 DIAGNOSIS — S60561A Insect bite (nonvenomous) of right hand, initial encounter: Secondary | ICD-10-CM

## 2015-10-13 DIAGNOSIS — Z79899 Other long term (current) drug therapy: Secondary | ICD-10-CM | POA: Insufficient documentation

## 2015-10-13 DIAGNOSIS — L089 Local infection of the skin and subcutaneous tissue, unspecified: Secondary | ICD-10-CM

## 2015-10-13 DIAGNOSIS — Y929 Unspecified place or not applicable: Secondary | ICD-10-CM | POA: Insufficient documentation

## 2015-10-13 DIAGNOSIS — Y939 Activity, unspecified: Secondary | ICD-10-CM | POA: Insufficient documentation

## 2015-10-13 MED ORDER — DOXYCYCLINE HYCLATE 100 MG PO CAPS: 100.0000 mg | ORAL_CAPSULE | Freq: Two times a day (BID) | ORAL | Status: DC

## 2015-10-13 NOTE — ED Provider Notes (Signed)
CSN: [id number]     Arrival date & time 10/13/15  1010 History  By signing my name below, I, Freida Busman, attest that this documentation has been prepared under the direction and in the presence of non-physician practitioner, Ebbie Ridge, PA-C. Electronically Signed: Freida Busman, Scribe. 10/13/2015. 10:45 AM.   Chief Complaint  Patient presents with  . Insect Bite   The history is provided by the patient. No language interpreter was used.     HPI Comments:  Jeanne Stark is a 25 y.o. female who presents to the Emergency Department complaining of gradually worsening swelling to the right ring finger x 2 weeks. She believes she may have been bitten by an spider but did not see it bite her. Pt has no other complaints or symptoms at this time. No alleviating factors noted.   Past Medical History  Diagnosis Date  . Gall stones   . Herpes   . Hx of carpal tunnel syndrome   . Preterm labor    Past Surgical History  Procedure Laterality Date  . Wisdom tooth extraction    . No past surgeries    . Cesarean section N/A 07/24/2013    Procedure: Emergent CESAREAN SECTION, repair of vaginal laceration;  Surgeon: Geryl Rankins, MD;  Location: [facility];  Service: Obstetrics;  Laterality: N/A;  . Laparoscopic tubal ligation Bilateral 09/20/2013    Procedure: LAPAROSCOPIC TUBAL LIGATION;  Surgeon: Kirkland Hun, MD;  Location: [facility];  Service: Gynecology;  Laterality: Bilateral;   Family History  Problem Relation Age of Onset  . Hyperlipidemia Mother   . COPD Maternal Aunt     breast  . COPD Maternal Grandmother     breast  . Cancer Maternal Grandmother     Breast   Social History  Substance Use Topics  . Smoking status: Never Smoker   . Smokeless tobacco: Never Used  . Alcohol Use: 0.6 oz/week    1 Shots of liquor per week     Comment: occasional   OB History    Gravida Para Term Preterm AB TAB SAB Ectopic Multiple Living   0 0 0 0 0 1 3     Review of Systems   Constitutional: Negative for fever and chills.  Respiratory: Negative for shortness of breath.   Cardiovascular: Negative for chest pain.  Skin: Positive for wound (finger swelling).   Allergies  Review of patient's allergies indicates no known allergies.  Home Medications   Prior to Admission medications   Medication Sig Start Date End Date Taking? Authorizing Provider  fluconazole (DIFLUCAN) 150 MG tablet Take 1 tablet (150 mg total) by mouth once. 10/10/15   Reva Bores, MD  metroNIDAZOLE (FLAGYL) 500 MG tablet Take 1 tablet (500 mg total) by mouth 2 (two) times daily. 10/11/15   Reva Bores, MD   BP 116/61 mmHg  Pulse 83  Temp(Src) 98.7 F (37.1 C) (Oral)  Resp 16  Ht  (1.575 m)  Wt 120 lb (54.432 kg)  BMI 21.94 kg/m2  SpO2 100%  LMP 10/01/2015 Physical Exam  Constitutional: She is oriented to person, place, and time. She appears well-developed and well-nourished. No distress.  HENT:  Head: Normocephalic and atraumatic.  Eyes: Conjunctivae are normal.  Cardiovascular: Normal rate.   Pulmonary/Chest: Effort normal.  Abdominal: She exhibits no distension.  Musculoskeletal:  Swelling noted to the medial aspect of the right ring finger over PIP  Neurological: She is alert and oriented to person, place, and  time.  Skin: Skin is warm and dry.  Psychiatric: She has a normal mood and affect.  Nursing note and vitals reviewed.   ED Course  Procedures   DIAGNOSTIC STUDIES:  Oxygen Saturation is 100% on RA, normal by my interpretation.    COORDINATION OF CARE:  10:45 AM Discussed treatment plan with pt at bedside and pt agreed to plan.  Labs Review Labs Reviewed - No data to display  Imaging Review No results found. I have personally reviewed and evaluated these images and lab results as part of my medical decision-making.  Patient be treated for cellulitis the finger.  Told to return here for any worsening in her condition.  This most likely was a bug bite  with secondary infection   Charlestine NightChristopher Sharice Harriss, PA-C 10/13/15 1626  Lavera Guiseana Duo Liu, MD 10/13/15 1758

## 2015-10-13 NOTE — Discharge Instructions (Signed)
Return here as needed.  Follow-up with an urgent care or Primary care doctor. Soak the finger in warm water with Epsom salts

## 2015-10-13 NOTE — ED Notes (Signed)
C/o insect bite on right 2nd finer right hand

## 2016-03-27 ENCOUNTER — Emergency Department (HOSPITAL_COMMUNITY)
Admission: EM | Admit: 2016-03-27 | Discharge: 2016-03-27 | Disposition: A | Discharge status: Home / Self Care | Payer: Medicaid Other | Attending: Emergency Medicine | Admitting: Emergency Medicine

## 2016-03-27 ENCOUNTER — Encounter (HOSPITAL_COMMUNITY): Payer: Self-pay | Admitting: Emergency Medicine

## 2016-03-27 DIAGNOSIS — N760 Acute vaginitis: Secondary | ICD-10-CM | POA: Insufficient documentation

## 2016-03-27 DIAGNOSIS — B9689 Other specified bacterial agents as the cause of diseases classified elsewhere: Secondary | ICD-10-CM | POA: Insufficient documentation

## 2016-03-27 DIAGNOSIS — H00011 Hordeolum externum right upper eyelid: Secondary | ICD-10-CM

## 2016-03-27 LAB — WET PREP, GENITAL
SPERM: NONE SEEN
Trich, Wet Prep: NONE SEEN
Yeast Wet Prep HPF POC: NONE SEEN

## 2016-03-27 LAB — CBC
HCT: 38.7 % (ref 36.0–46.0)
Hemoglobin: 12.5 g/dL (ref 12.0–15.0)
MCH: 25.6 pg — AB (ref 26.0–34.0)
MCHC: 32.3 g/dL (ref 30.0–36.0)
MCV: 79.1 fL (ref 78.0–100.0)
PLATELETS: 245 10*3/uL (ref 150–400)
RBC: 4.89 MIL/uL (ref 3.87–5.11)
RDW: 14.2 % (ref 11.5–15.5)
WBC: 6 10*3/uL (ref 4.0–10.5)

## 2016-03-27 LAB — COMPREHENSIVE METABOLIC PANEL
ALK PHOS: 45 U/L (ref 38–126)
ALT: 15 U/L (ref 14–54)
AST: 16 U/L (ref 15–41)
Albumin: 4.4 g/dL (ref 3.5–5.0)
Anion gap: 9 (ref 5–15)
BILIRUBIN TOTAL: 0.5 mg/dL (ref 0.3–1.2)
BUN: 5 mg/dL — AB (ref 6–20)
CALCIUM: 9.5 mg/dL (ref 8.9–10.3)
CO2: 23 mmol/L (ref 22–32)
CREATININE: 0.74 mg/dL (ref 0.44–1.00)
Chloride: 104 mmol/L (ref 101–111)
GFR calc Af Amer: >60 mL/min (ref >60)
Glucose, Bld: 76 mg/dL (ref 65–99)
Potassium: 3.8 mmol/L (ref 3.5–5.1)
SODIUM: 136 mmol/L (ref 135–145)
TOTAL PROTEIN: 7.3 g/dL (ref 6.5–8.1)

## 2016-03-27 LAB — URINALYSIS, ROUTINE W REFLEX MICROSCOPIC
Bilirubin Urine: NEGATIVE
GLUCOSE, UA: NEGATIVE mg/dL
HGB URINE DIPSTICK: NEGATIVE
KETONES UR: 15 mg/dL — AB
Leukocytes, UA: NEGATIVE
Nitrite: NEGATIVE
PH: 6 (ref 5.0–8.0)
PROTEIN: NEGATIVE mg/dL
Specific Gravity, Urine: 1.011 (ref 1.005–1.030)

## 2016-03-27 LAB — LIPASE, BLOOD: LIPASE: 26 U/L (ref 11–51)

## 2016-03-27 LAB — I-STAT BETA HCG BLOOD, ED (MC, WL, AP ONLY): I-stat hCG, quantitative: <5 m[IU]/mL (ref <5)

## 2016-03-27 MED ORDER — METRONIDAZOLE 500 MG PO TABS: 500.0000 mg | ORAL_TABLET | Freq: Two times a day (BID) | ORAL | 0 refills | Status: DC

## 2016-03-27 MED ORDER — POLYMYXIN B-TRIMETHOPRIM 10000-0.1 UNIT/ML-% OP SOLN: 2.0000 [drp] | OPHTHALMIC | 0 refills | Status: DC

## 2016-03-27 MED ORDER — METRONIDAZOLE 500 MG PO TABS
500.0000 mg | ORAL_TABLET | Freq: Once | ORAL | Status: AC
  Administered 2016-03-27: 500 mg via ORAL
  Filled 2016-03-27: qty 1

## 2016-03-27 NOTE — ED Provider Notes (Signed)
[facility] DEPT Provider Note   CSN: [id number] Arrival date & time: 03/27/16  1756     History   Chief Complaint Chief Complaint  Patient presents with  . Eye Problem  . Abdominal Pain    HPI Jeanne Stark is a 25 y.o. female.  Pt presents to the ED with abdominal pain for the past week.  She said it is a crampy type pain.  She also has a foul odor from her vagina.  She thinks it is BV, which she has had in the past.  Pt also noticed a bump to her right eyelid that had pus coming out of it today.  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      Past Medical History:  Diagnosis Date  . Gall stones   . Herpes   . Hx of carpal tunnel syndrome   . Preterm labor     Patient Active Problem List   Diagnosis Date Noted  . 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 Gallstones 06/21/2013  . 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 CESAREAN SECTION N/A 07/24/2013   Procedure: Emergent CESAREAN SECTION, repair of vaginal laceration;  Surgeon: Geryl RankinsEvelyn Varnado, MD;  Location: [facility];  Service: Obstetrics;  Laterality: N/A;  . LAPAROSCOPIC TUBAL LIGATION Bilateral 09/20/2013   Procedure: LAPAROSCOPIC TUBAL LIGATION;  Surgeon: Kirkland HunArthur Stringer, MD;  Location: [facility];  Service: Gynecology;  Laterality: Bilateral;  . NO PAST SURGERIES    . WISDOM TOOTH EXTRACTION      OB History    Gravida Para Term Preterm AB Living   3 3 2  0 0 3   SAB TAB Ectopic Multiple Live Births   0 0 0 1 3       Home Medications    Prior to Admission medications   Medication Sig Start Date End Date Taking? Authorizing Provider  doxycycline (VIBRAMYCIN) 100 MG capsule Take 1 capsule (100 mg total) by mouth 2 (two) times daily. 10/13/15   Charlestine Nighthristopher Lawyer, PA-C  fluconazole (DIFLUCAN) 150 MG tablet  Take 1 tablet (150 mg total) by mouth once. 10/10/15   Reva Boresanya S Pratt, MD  metroNIDAZOLE (FLAGYL) 500 MG tablet Take 1 tablet (500 mg total) by mouth 2 (two) times daily. 03/27/16   Jacalyn LefevreJulie Clariza Sickman, MD  trimethoprim-polymyxin b (POLYTRIM) ophthalmic solution Place 2 drops into the right eye every 4 (four) hours. 03/27/16   Jacalyn LefevreJulie Cormac Wint, MD    Family History Family History  Problem Relation Age of Onset  . Hyperlipidemia Mother   . COPD Maternal Grandmother     breast  . Cancer Maternal Grandmother     Breast  . COPD Maternal Aunt     breast    Social History Social History  Substance Use Topics  . Smoking status: Never Smoker  . Smokeless tobacco: Never Used  . Alcohol use 0.6 oz/week    1 Shots of liquor per week     Comment: occasional     Allergies   Patient has no known allergies.   Review of Systems Review of Systems  Eyes: Positive for discharge.  Gastrointestinal: Positive for abdominal pain.  Genitourinary: Positive for vaginal discharge.  All other systems reviewed and are negative.    Physical Exam Updated Vital Signs BP 108/61 (BP Location: Left Arm)   Pulse 85   Temp  98.3 F (36.8 C) (Oral)   Resp 15   LMP 03/11/2016   SpO2 100%   Physical Exam  Constitutional: She is oriented to person, place, and time. She appears well-developed and well-nourished.  HENT:  Head: Normocephalic and atraumatic.  Right Ear: External ear normal.  Left Ear: External ear normal.  Nose: Nose normal.  Mouth/Throat: Oropharynx is clear and moist.  Eyes: Conjunctivae and EOM are normal. Pupils are equal, round, and reactive to light. Right eye exhibits hordeolum.  Neck: Normal range of motion. Neck supple.  Cardiovascular: Normal rate, regular rhythm, normal heart sounds and intact distal pulses.   Pulmonary/Chest: Effort normal and breath sounds normal.  Abdominal: Soft. Bowel sounds are normal.  Genitourinary: Uterus normal. Cervix exhibits discharge and friability.  Right adnexum displays no tenderness. Left adnexum displays no tenderness. Vaginal discharge found.  Neurological: She is alert and oriented to person, place, and time.  Skin: Skin is warm.  Psychiatric: She has a normal mood and affect. Her behavior is normal. Judgment and thought content normal.  Nursing note and vitals reviewed.    ED Treatments / Results  Labs (all labs ordered are listed, but only abnormal results are displayed) Labs Reviewed  WET PREP, GENITAL - Abnormal; Notable for the following:       Result Value   Clue Cells Wet Prep HPF POC PRESENT (*)    WBC, Wet Prep HPF POC MANY (*)    All other components within normal limits  COMPREHENSIVE METABOLIC PANEL - Abnormal; Notable for the following:    BUN 5 (*)    All other components within normal limits  CBC - Abnormal; Notable for the following:    MCH 25.6 (*)    All other components within normal limits  URINALYSIS, ROUTINE W REFLEX MICROSCOPIC (NOT AT [facility]) - Abnormal; Notable for the following:    Ketones, ur 15 (*)    All other components within normal limits  LIPASE, BLOOD  I-STAT BETA HCG BLOOD, ED ([facility], [facility], [facility] ONLY)  GC/CHLAMYDIA PROBE AMP ([place]) NOT AT [facility]    EKG  EKG Interpretation None       Radiology No results found.  Procedures Procedures (including critical care time)  Medications Ordered in ED Medications  metroNIDAZOLE (FLAGYL) tablet 500 mg (not administered)     Initial Impression / Assessment and Plan / ED Course  I have reviewed the triage vital signs and the nursing notes.  Pertinent labs & imaging results that were available during my care of the patient were reviewed by me and considered in my medical decision making (see chart for details).  Clinical Course     Pt to be d/c'd home with flagyl.  She is also given Polytrim for her eye.  She is instructed that sexual partners need to be treated.  She knows to return if worse.  Final Clinical Impressions(s) / ED  Diagnoses   Final diagnoses:  Hordeolum externum of right upper eyelid  Bacterial vaginosis    New Prescriptions New Prescriptions   METRONIDAZOLE (FLAGYL) 500 MG TABLET    Take 1 tablet (500 mg total) by mouth 2 (two) times daily.   TRIMETHOPRIM-POLYMYXIN B (POLYTRIM) OPHTHALMIC SOLUTION    Place 2 drops into the right eye every 4 (four) hours.     Jacalyn LefevreJulie Harrie Cazarez, MD 03/27/16 2014

## 2016-03-27 NOTE — ED Notes (Addendum)
Pt complains of a possible abscess on her right upper eye lid for 2 days. Also complains of ABD pain for 1 week. Denies possibility of pregnancy or menstrual cycle. Pt states she feels that the pain in her belly is more of a vaginal or femanine organ pain then belly pain.

## 2016-03-27 NOTE — ED Triage Notes (Signed)
Pt states she has been having abd pain/cramping for a week. Pt concerned she has bacterial vaginosis. Pt states she has had a foul odor in her vagina as well. Pt also complaining of right eyelid drainage. Pt states her eyelid was swollen and she pressed on it and pus came out. Eye is not red on assessment. No visual changes.

## 2016-03-29 LAB — GC/CHLAMYDIA PROBE AMP (~~LOC~~) NOT AT ARMC
CHLAMYDIA, DNA PROBE: NEGATIVE
NEISSERIA GONORRHEA: NEGATIVE

## 2016-12-30 ENCOUNTER — Other Ambulatory Visit (INDEPENDENT_AMBULATORY_CARE_PROVIDER_SITE_OTHER): Payer: Self-pay | Admitting: *Deleted

## 2016-12-30 ENCOUNTER — Other Ambulatory Visit (HOSPITAL_COMMUNITY)
Admission: RE | Admit: 2016-12-30 | Discharge: 2016-12-30 | Disposition: A | Discharge status: Home / Self Care | Payer: Medicaid Other | Source: Ambulatory Visit | Attending: Family Medicine | Admitting: Family Medicine

## 2016-12-30 DIAGNOSIS — N898 Other specified noninflammatory disorders of vagina: Secondary | ICD-10-CM

## 2016-12-30 DIAGNOSIS — Z113 Encounter for screening for infections with a predominantly sexual mode of transmission: Secondary | ICD-10-CM

## 2016-12-30 NOTE — Progress Notes (Signed)
SUBJECTIVE:  26 y.o. female complains of frothy vaginal discharge for 2 week(s) with vaginal odor. Denies abnormal vaginal bleeding or significant pelvic pain or fever. No UTI symptoms. Denies history of known exposure to STD.  No LMP recorded.  OBJECTIVE:  She appears well, afebrile. Urine dipstick: not done.  ASSESSMENT:  Vaginal Discharge  Vaginal Odor   PLAN:  GC, chlamydia, trichomonas, BVAG, CVAG probe sent to lab. Treatment: To be determined once lab results are received ROV prn if symptoms persist or worsen.

## 2016-12-31 LAB — CERVICOVAGINAL ANCILLARY ONLY
Bacterial vaginitis: POSITIVE — AB
CHLAMYDIA, DNA PROBE: NEGATIVE
Candida vaginitis: NEGATIVE
Neisseria Gonorrhea: NEGATIVE
Trichomonas: NEGATIVE

## 2016-12-31 NOTE — Progress Notes (Signed)
Patient seen and assessed by nursing staff.  Agree with documentation and plan.  

## 2017-01-01 ENCOUNTER — Other Ambulatory Visit: Payer: Self-pay

## 2017-01-01 MED ORDER — METRONIDAZOLE 500 MG PO TABS: 500.0000 mg | ORAL_TABLET | Freq: Two times a day (BID) | ORAL | 0 refills | Status: DC

## 2017-03-12 ENCOUNTER — Encounter: Payer: Self-pay | Admitting: Nurse Practitioner

## 2017-03-12 ENCOUNTER — Ambulatory Visit: Payer: Medicaid Other | Admitting: Nurse Practitioner

## 2017-03-12 ENCOUNTER — Other Ambulatory Visit (HOSPITAL_COMMUNITY)
Admission: RE | Admit: 2017-03-12 | Discharge: 2017-03-12 | Disposition: A | Discharge status: Home / Self Care | Payer: Medicaid Other | Source: Ambulatory Visit | Attending: Nurse Practitioner | Admitting: Nurse Practitioner

## 2017-03-12 ENCOUNTER — Ambulatory Visit (INDEPENDENT_AMBULATORY_CARE_PROVIDER_SITE_OTHER): Payer: Medicaid Other | Admitting: Nurse Practitioner

## 2017-03-12 VITALS — BP 94/61 | HR 92 | Wt 103.2 lb

## 2017-03-12 DIAGNOSIS — Z309 Encounter for contraceptive management, unspecified: Secondary | ICD-10-CM | POA: Diagnosis not present

## 2017-03-12 DIAGNOSIS — N6312 Unspecified lump in the right breast, upper inner quadrant: Secondary | ICD-10-CM

## 2017-03-12 DIAGNOSIS — Z01411 Encounter for gynecological examination (general) (routine) with abnormal findings: Secondary | ICD-10-CM

## 2017-03-12 NOTE — Progress Notes (Signed)
Lump in left breast Last pap 10/2005 Need to start Gardisil

## 2017-03-12 NOTE — Progress Notes (Signed)
GYNECOLOGY ANNUAL PREVENTATIVE CARE ENCOUNTER NOTE  Subjective:   Jeanne Stark is a 26 y.o. 523P2003 female here for a routine annual gynecologic exam.  Current complaints: lump in breast.   Denies abnormal vaginal bleeding, discharge, pelvic pain, problems with intercourse or other gynecologic concerns.    Gynecologic History LMP ended on 03-10-17 Contraception: tubal ligation Last Pap: 10/10/15. Results were: normal  HPI - complains of breast lump for past 3 weeks in right breast.  Is not enlarging.  Is not tender.  No history of any breast problem previously  Patient Active Problem List   Diagnosis Date Noted  . 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 Gallstones 06/21/2013  . 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 Gall stones   . Herpes   . Hx of carpal tunnel syndrome   . Preterm labor     Past Surgical History:  Procedure Laterality Date  . NO PAST SURGERIES    . WISDOM TOOTH EXTRACTION      Current Outpatient Medications on File Prior to Visit  Medication Sig Dispense Refill  . doxycycline (VIBRAMYCIN) 100 MG capsule Take 1 capsule (100 mg total) by mouth 2 (two) times daily. (Patient not taking: Reported on 03/12/2017) 20 capsule 0  . fluconazole (DIFLUCAN) 150 MG tablet Take 1 tablet (150 mg total) by mouth once. (Patient not taking: Reported on 03/12/2017)  2 tablet 2  . metroNIDAZOLE (FLAGYL) 500 MG tablet Take 1 tablet (500 mg total) by mouth 2 (two) times daily. (Patient not taking: Reported on 03/12/2017) 14 tablet 0  . trimethoprim-polymyxin b (POLYTRIM) ophthalmic solution Place 2 drops into the right eye every 4 (four) hours. (Patient not taking: Reported on 03/12/2017) 10 mL 0   No current facility-administered medications on file prior to visit.     No Known Allergies  Social History   Socioeconomic History  . Marital status: Single    Spouse name: Not on file  . Number of children: Not on file  . Years of education: Not on file  . Highest education level: Not on file  Social Needs  . Financial resource strain: Not on file  . Food insecurity - worry: Not on file  . Food insecurity - inability: Not on file  . Transportation needs - medical: Not on file  . Transportation needs - non-medical: Not on file  Occupational History  . Not on file  Tobacco Use  . Smoking status: Never Smoker  . Smokeless tobacco: Never Used  Substance and Sexual Activity  . Alcohol use: Yes    Alcohol/week: 0.6 oz    Types: 1 Shots of liquor per week    Comment: occasional  .  Drug use: No  . Sexual activity: Yes    Birth control/protection: None, Surgical, Condom  Other Topics Concern  . Not on file  Social History Narrative  . Not on file    Family History  Problem Relation Age of Onset  . Hyperlipidemia Mother   . COPD Maternal Grandmother        breast  . Cancer Maternal Grandmother        Breast  . COPD Maternal Aunt        breast    The following portions of the patient's history were reviewed and updated as appropriate: allergies, current medications, past family history, past medical history, past social history, past surgical history and problem list.  Review of Systems Pertinent items noted in HPI and remainder of comprehensive ROS otherwise negative.   Objective:  BP 94/61   Pulse 92   Wt 103 lb 3.2 oz (46.8 kg)   BMI  18.88 kg/m  CONSTITUTIONAL: Well-developed, well-nourished female in no acute distress.  HENT:  Normocephalic, atraumatic, External right and left ear normal. Oropharynx is clear and moist EYES: Conjunctivae and EOM are normal. Pupils are equal, round, and reactive to light. No scleral icterus.  NECK: Normal range of motion, supple, no masses.  Normal thyroid.  SKIN: Skin is warm and dry. No rash noted. Not diaphoretic. No erythema. No pallor. NEUROLOGIC: Alert and oriented to person, place, and time. Normal muscle tone coordination. No cranial nerve deficit noted. PSYCHIATRIC: Normal mood and affect. Normal behavior. Normal judgment and thought content. CARDIOVASCULAR: Normal heart rate noted, regular rhythm RESPIRATORY: Clear to auscultation bilaterally. Effort and breath sounds normal, no problems with respiration noted. BREASTS: Symmetric in size. No skin changes, nipple drainage, or lymphadenopathy.  Breast mass noted on right side at 1 o'clock - firm, smooth, mobile and non-tender.  Size is 1 cm x 1.25 cm ABDOMEN: Soft, no distention noted.  No tenderness, rebound or guarding.  PELVIC: Normal appearing external genitalia; normal appearing vaginal mucosa and cervix.  No abnormal discharge noted.  Pap smear obtained.  Normal uterine size, no other palpable masses, no uterine or adnexal tenderness. MUSCULOSKELETAL: Normal range of motion. No tenderness.  No cyanosis, clubbing, or edema.     Assessment and Plan:  New Breast Lump in right breast Otherwise normal preventative exam  BTL is contraception  Plan Make an appointment for BCCEP to have breast lump evaluated.  Will need to be seen ASAP for further evaluation - likely a breast ultrasound will be needed. Return in one year for annual exam. Get Flu shot at your local pharmacy Will follow up results of pap smear and manage accordingly. Routine preventative health maintenance measures emphasized. Please refer to After Visit Summary  for other counseling recommendations.    Nolene BernheimERRI Ariellah Faust, RN, MSN, NP-BC Nurse Practitioner, [facility]

## 2017-03-12 NOTE — Patient Instructions (Addendum)
Make an appointment for B[facility]EP to have breast lump evaluated.  Will need to be seen ASAP for further evaluation Return in one year for annual exam. Get Flu shot at your local pharmacy

## 2017-03-13 LAB — CYTOLOGY - PAP
Candida vaginitis: NEGATIVE
Chlamydia: NEGATIVE
Diagnosis: NEGATIVE
Neisseria Gonorrhea: NEGATIVE
TRICH (WINDOWPATH): NEGATIVE

## 2017-03-17 ENCOUNTER — Other Ambulatory Visit (HOSPITAL_COMMUNITY): Payer: Self-pay | Admitting: *Deleted

## 2017-03-17 DIAGNOSIS — N6310 Unspecified lump in the right breast, unspecified quadrant: Secondary | ICD-10-CM

## 2017-03-20 ENCOUNTER — Ambulatory Visit
Admission: RE | Admit: 2017-03-20 | Discharge: 2017-03-20 | Disposition: A | Discharge status: Home / Self Care | Payer: No Typology Code available for payment source | Source: Ambulatory Visit | Attending: Obstetrics and Gynecology | Admitting: Obstetrics and Gynecology

## 2017-03-20 ENCOUNTER — Other Ambulatory Visit (HOSPITAL_COMMUNITY): Payer: Self-pay | Admitting: Obstetrics and Gynecology

## 2017-03-20 ENCOUNTER — Encounter (HOSPITAL_COMMUNITY): Payer: Self-pay

## 2017-03-20 ENCOUNTER — Ambulatory Visit (HOSPITAL_COMMUNITY)
Admission: RE | Admit: 2017-03-20 | Discharge: 2017-03-20 | Disposition: A | Discharge status: Home / Self Care | Payer: Self-pay | Source: Ambulatory Visit | Attending: Obstetrics and Gynecology | Admitting: Obstetrics and Gynecology

## 2017-03-20 VITALS — BP 100/60 | Temp 97.8°F | Ht 62.0 in | Wt 102.8 lb

## 2017-03-20 DIAGNOSIS — N6310 Unspecified lump in the right breast, unspecified quadrant: Secondary | ICD-10-CM

## 2017-03-20 DIAGNOSIS — Z1239 Encounter for other screening for malignant neoplasm of breast: Secondary | ICD-10-CM

## 2017-03-20 DIAGNOSIS — N6322 Unspecified lump in the left breast, upper inner quadrant: Secondary | ICD-10-CM

## 2017-03-20 NOTE — Patient Instructions (Signed)
Explained breast self awareness with [place]Lyndall N Hollabaugh. Patient did not need a Pap smear today due to last Pap smear was 03/12/2017. Let her know BCCCP will cover Pap smears every 3 years unless has a history of abnormal Pap smears. [facility]. Appointment scheduled for Thursday, March 20, 2017 at 1330. [place] F[place] verbalized understanding.  Albie Bazin, Kathaleen Maserhristine Poll, RN 1:00 PM

## 2017-03-20 NOTE — Progress Notes (Signed)
Complaints of right breast lump x 2-3 months that has increased in size per patient.  Pap Smear: Pap smear not completed today. [facility] and normal. Per patient has a history of an abnormal Pap smear between 2010-2012 that a repeat Pap smear in one year was completed that was normal. Last Pap smear result is in Epic.  Physical exam: Breasts Breasts symmetrical. No skin abnormalities bilateral breasts. No nipple retraction bilateral breasts. No nipple discharge bilateral breasts. No lymphadenopathy. No lumps palpated left breast. Palpated a pea sized lump within the right breast at 11 o'clock 2 cm from the nipple. No complaints of pain or tenderness on exam. [facility] right breast ultrasound. Appointment scheduled for Thursday, March 20, 2017 at 1330.        Pelvic/Bimanual No Pap smear completed today since last Pap smear was 03/12/2017. Pap smear not indicated per BCCCP guidelines.   Smoking History: Patient has never smoked.  Patient Navigation: Patient education provided. Access to services provided for [facility] through [facility].

## 2017-03-21 ENCOUNTER — Encounter (HOSPITAL_COMMUNITY): Payer: Self-pay | Admitting: *Deleted

## 2017-04-08 ENCOUNTER — Ambulatory Visit (INDEPENDENT_AMBULATORY_CARE_PROVIDER_SITE_OTHER): Payer: No Typology Code available for payment source | Admitting: Obstetrics & Gynecology

## 2017-04-08 ENCOUNTER — Encounter: Payer: Self-pay | Admitting: Obstetrics & Gynecology

## 2017-04-08 VITALS — BP 104/64 | HR 81 | Wt 102.8 lb

## 2017-04-08 DIAGNOSIS — Z9851 Tubal ligation status: Secondary | ICD-10-CM

## 2017-04-08 NOTE — Progress Notes (Signed)
RGYN pt presents for consult visit to discuss BTL Reversal.

## 2017-04-08 NOTE — Patient Instructions (Addendum)
Dr. April MansonYalcinkaya  [facility] : Dr. Jewel Baizeeaton  [facility]   Hysterosalpingography Hysterosalpingography is a procedure to look inside your uterus and fallopian tubes. During this procedure, contrast dye is injected into your uterus through your vagina and cervix to illuminate your uterus while X-ray pictures are taken. This procedure may help your health care provider determine whether you have uterine tumors, adhesions, or structural abnormalities. It is commonly used to help determine why a woman is unable to have children (infertility). The procedure usually lasts about 15-30 minutes. LET [facility] KNOW ABOUT:  Any allergies you have.  All medicines you are taking, including vitamins, herbs, eye drops, creams, and over-the-counter medicines.  Previous problems you or members of your family have had with the use of anesthetics.  Any blood disorders you have.  Previous surgeries you have had.  Medical conditions you have. RISKS AND COMPLICATIONS Generally, this is a safe procedure. However, as with any procedure, problems can occur. Possible problems include:  Infection in the lining of the uterus (endometritis) or fallopian tubes (salpingitis).  Damage or perforation of the uterus or fallopian tubes.  An allergic reaction to the contrast dye used to perform the X-ray.  BEFORE THE PROCEDURE  Schedule the procedure after your period stops, but before your next ovulation. This is usually between day 5 and day 10 of your last period. Day 1 is the first day of your period.  Ask your health care provider about changing or stopping your regular medicines.  You may eat and drink as normal.  Empty your bladder before the procedure begins. PROCEDURE  You may be given a medicine to relax you (sedative) or an over-the-counter pain medicine to lessen any discomfort during the procedure.  You will lie down on an X-ray table with your feet in  stirrups.  A device called a speculum will be placed into your vagina. This allows your health care provider to see inside your vagina to the cervix.  The cervix will be washed with a special soap.  A thin, flexible tube will be passed through the cervix into the uterus.  Contrast dye will be put into this tube.  Several X-rays will be taken as the contrast dye spreads through the uterus and fallopian tubes.  The tube will be taken out after the procedure. What to expect after the procedure  Most of the contrast dye will flow out of your vagina naturally. You may want to wear a sanitary pad.  You may feel mild cramping and notice a little bleeding from your vagina. This should go away in 24 hours.  Ask when your test results will be ready. Make sure you get your test results. This information is not intended to replace advice given to you by your health care provider. Make sure you discuss any questions you have with your health care provider. Document Released: 05/25/2004 Document Revised: 09/28/2015 Document Reviewed: 10/23/2012 Elsevier Interactive Patient Education  2017 Elsevier Inc.   Diagnostic Laparoscopy A diagnostic laparoscopy is a procedure to diagnose diseases in the abdomen. During the procedure, a thin, lighted, pencil-sized instrument called a laparoscope is inserted into the abdomen through an incision. The laparoscope allows your health care provider to look at the organs inside your body. Tell a health care provider about:  Any allergies you have.  All medicines you are taking, including vitamins, herbs, eye drops, creams, and over-the-counter medicines.  Any problems you or family members have had with anesthetic medicines.  Any blood disorders you have.  Any surgeries you have had.  Any medical conditions you have. What are the risks? Generally, this is a safe procedure. However, problems can occur, which may include:  Infection.  Bleeding.  Damage  to other organs.  Allergic reaction to the anesthetics used during the procedure.  What happens before the procedure?  Do not eat or drink anything after midnight on the night before the procedure or as directed by your health care provider.  Ask your health care provider about: ? Changing or stopping your regular medicines. ? Taking medicines such as aspirin and ibuprofen. These medicines can thin your blood. Do not take these medicines before your procedure if your health care provider instructs you not to.  Plan to have someone take you home after the procedure. What happens during the procedure?  You may be given a medicine to help you relax (sedative).  You will be given a medicine to make you sleep (general anesthetic).  Your abdomen will be inflated with a gas. This will make your organs easier to see.  Small incisions will be made in your abdomen.  A laparoscope and other small instruments will be inserted into the abdomen through the incisions.  A tissue sample may be removed from an organ in the abdomen for examination.  The instruments will be removed from the abdomen.  The gas will be released.  The incisions will be closed with stitches (sutures). What happens after the procedure? Your blood pressure, heart rate, breathing rate, and blood oxygen level will be monitored often until the medicines you were given have worn off. This information is not intended to replace advice given to you by your health care provider. Make sure you discuss any questions you have with your health care provider. Document Released: 07/29/2000 Document Revised: 08/31/2015 Document Reviewed: 12/03/2013 Elsevier Interactive Patient Education  [employer]2018 Elsevier Inc.

## 2017-04-08 NOTE — Progress Notes (Signed)
   GYNECOLOGY OFFICE VISIT NOTE  History:  26 y.o. G2P2003 here today for discussion about evaluation of fallopian tubes s/p laparoscopic tubal sterilization via cauterization in 09/20/2013.  Considering tubal re-anastomosis. She denies any abnormal vaginal discharge, bleeding, pelvic pain or other concerns.   Past Medical History:  Diagnosis Date  . Gall stones   . Herpes   . Hx of carpal tunnel syndrome   . Preterm labor     Past Surgical History:  Procedure Laterality Date  . CESAREAN SECTION N/A 07/24/2013   Procedure: Emergent CESAREAN SECTION, repair of vaginal laceration;  Surgeon: Geryl RankinsEvelyn Varnado, MD;  Location: [facility];  Service: Obstetrics;  Laterality: N/A;  . LAPAROSCOPIC TUBAL LIGATION Bilateral 09/20/2013   Procedure: LAPAROSCOPIC TUBAL LIGATION;  Surgeon: Kirkland HunArthur Stringer, MD;  Location: [facility];  Service: Gynecology;  Laterality: Bilateral;  . NO PAST SURGERIES    . TUBAL LIGATION    . WISDOM TOOTH EXTRACTION     The following portions of the patient's history were reviewed and updated as appropriate: allergies, current medications, past family history, past medical history, past social history, past surgical history and problem list.   Health Maintenance:  Normal pap on 03/12/2017.  Review of Systems:  Pertinent items noted in HPI and remainder of comprehensive ROS otherwise negative.  Objective:  Physical Exam BP 104/64   Pulse 81   Wt 102 lb 12.8 oz (46.6 kg)   LMP 04/02/2017 (Exact Date)   BMI 18.80 kg/m  CONSTITUTIONAL: Well-developed, well-nourished female in no acute distress.  NEUROLOGIC: Alert and oriented to person, place, and time. Normal reflexes, muscle tone coordination. No cranial nerve deficit noted. PSYCHIATRIC: Normal mood and affect. Normal behavior. Normal judgment and thought content. CARDIOVASCULAR: Normal heart rate noted RESPIRATORY: Effort and breath sounds normal, no problems with respiration noted ABDOMEN: Soft, no distention noted.     PELVIC: Deferred MUSCULOSKELETAL: Normal range of motion. No edema noted.   Assessment & Plan:  1. Tubal ligation status Had a long discussion about permanence of sterilization, difficult about re-anastomosis. Told her we do not offer this surgery. Recommended getting a HSG study and evaluation by infertility specialists (Dr. [facility]alcinkaya [facility]    Routine preventative health maintenance measures emphasized. Please refer to After Visit Summary for other counseling recommendations.   Total face-to-face time with patient: 15 minutes.  Over 50% of encounter was spent on counseling and coordination of care.   Jaynie CollinsUGONNA  Tanelle Lanzo, MD, FACOG Attending Obstetrician & Gynecologist, [facility], [facility]

## 2017-04-27 ENCOUNTER — Encounter (HOSPITAL_COMMUNITY): Payer: Self-pay | Admitting: *Deleted

## 2017-04-27 ENCOUNTER — Inpatient Hospital Stay (HOSPITAL_COMMUNITY)
Admission: AD | Admit: 2017-04-27 | Discharge: 2017-04-27 | Disposition: A | Discharge status: Home / Self Care | Payer: No Typology Code available for payment source | Source: Ambulatory Visit | Attending: Obstetrics and Gynecology | Admitting: Obstetrics and Gynecology

## 2017-04-27 DIAGNOSIS — M549 Dorsalgia, unspecified: Secondary | ICD-10-CM | POA: Insufficient documentation

## 2017-04-27 DIAGNOSIS — Z9889 Other specified postprocedural states: Secondary | ICD-10-CM | POA: Insufficient documentation

## 2017-04-27 DIAGNOSIS — Z3202 Encounter for pregnancy test, result negative: Secondary | ICD-10-CM | POA: Insufficient documentation

## 2017-04-27 DIAGNOSIS — N939 Abnormal uterine and vaginal bleeding, unspecified: Secondary | ICD-10-CM | POA: Insufficient documentation

## 2017-04-27 DIAGNOSIS — G5600 Carpal tunnel syndrome, unspecified upper limb: Secondary | ICD-10-CM | POA: Insufficient documentation

## 2017-04-27 DIAGNOSIS — Z9851 Tubal ligation status: Secondary | ICD-10-CM | POA: Insufficient documentation

## 2017-04-27 LAB — POCT PREGNANCY, URINE: Preg Test, Ur: NEGATIVE

## 2017-04-27 LAB — URINALYSIS, ROUTINE W REFLEX MICROSCOPIC
Bilirubin Urine: NEGATIVE
GLUCOSE, UA: NEGATIVE mg/dL
Hgb urine dipstick: NEGATIVE
Ketones, ur: NEGATIVE mg/dL
LEUKOCYTES UA: NEGATIVE
Nitrite: NEGATIVE
PH: 8 (ref 5.0–8.0)
PROTEIN: NEGATIVE mg/dL
SPECIFIC GRAVITY, URINE: 1.01 (ref 1.005–1.030)

## 2017-04-27 LAB — HCG, SERUM, QUALITATIVE: Preg, Serum: NEGATIVE

## 2017-04-27 NOTE — MAU Provider Note (Signed)
Chief Complaint: Back Pain and Possible Pregnancy   None     SUBJECTIVE HPI: Jeanne Stark is a 26 y.o. G2P2003 who presents to maternity admissions reporting concerns about pregnancy with some mild lower abdominal pain, mood swings, and urinary frequency with hx of BTL in 2015. [facility] reporting interest in reversing her tubal ligation to become pregnant.  She knows that ectopic pregnancy is a risk if pregnant after a BTL so when her period was unusual last month and her other symptoms started, she became concerned.  She had a normal period on 03/10/17 but then had light spotting on 04/02/17 lasting 3 days. This is unusual as her periods are usually very regular.  Her pain is low cramping, more in the LLQ but occurring bilaterally, intermittent, and unchanged in intensity since onset this week.  She has not tried any treatments. There are no other associated symptoms.   HPI  Past Medical History:  Diagnosis Date  . Gall stones   . Herpes   . Hx of carpal tunnel syndrome   . Preterm labor    Past Surgical History:  Procedure Laterality Date  . CESAREAN SECTION N/A 07/24/2013   Procedure: Emergent CESAREAN SECTION, repair of vaginal laceration;  Surgeon: Geryl RankinsEvelyn Varnado, MD;  Location: [facility];  Service: Obstetrics;  Laterality: N/A;  . LAPAROSCOPIC TUBAL LIGATION Bilateral 09/20/2013   Procedure: LAPAROSCOPIC TUBAL LIGATION;  Surgeon: Kirkland HunArthur Stringer, MD;  Location: [facility];  Service: Gynecology;  Laterality: Bilateral;  . NO PAST SURGERIES    . TUBAL LIGATION    . WISDOM TOOTH EXTRACTION     Social History   Socioeconomic History  . Marital status: Single    Spouse name: Not on file  . Number of children: Not on file  . Years of education: Not on file  . Highest education level: Not on file  Social Needs  . Financial resource strain: Not on file  . Food insecurity - worry: Not on file  . Food insecurity - inability: Not on file  . Transportation  needs - medical: Not on file  . Transportation needs - non-medical: Not on file  Occupational History  . Not on file  Tobacco Use  . Smoking status: Never Smoker  . Smokeless tobacco: Never Used  Substance and Sexual Activity  . Alcohol use: No    Alcohol/week: 0.6 oz    Types: 1 Shots of liquor per week    Frequency: Never    Comment: occasional  . Drug use: No  . Sexual activity: Yes    Birth control/protection: None, Surgical, Condom  Other Topics Concern  . Not on file  Social History Narrative  . Not on file   No current facility-administered medications on file prior to encounter.    Current Outpatient Medications on File Prior to Encounter  Medication Sig Dispense Refill  . OVER THE COUNTER MEDICATION Take 2 capsules by mouth 2 (two) times daily. Bladderwrack and sea moss herbal supplement     No Known Allergies  ROS:  Review of Systems  Constitutional: Negative for chills, fatigue and fever.  Respiratory: Negative for shortness of breath.   Cardiovascular: Negative for chest pain.  Gastrointestinal: Positive for abdominal pain.  Genitourinary: Positive for frequency and pelvic pain. Negative for difficulty urinating, dysuria, flank pain, vaginal bleeding, vaginal discharge and vaginal pain.  Neurological: Negative for dizziness and headaches.  Psychiatric/Behavioral: Negative.      I have reviewed patient's  Past Medical Hx, Surgical Hx, Family Hx, Social Hx, medications and allergies.   Physical Exam   Patient Vitals for the past 24 hrs:  BP Temp Temp src Pulse Resp SpO2 Height Weight  04/27/17 0720 110/60 98.8 F (37.1 C) Oral 81 18 99 % - -  04/27/17 0715 - - - - - - 5\' 2"  (1.575 m) 103 lb 8 oz (46.9 kg)   Constitutional: Well-developed, well-nourished female in no acute distress.  Cardiovascular: normal rate Respiratory: normal effort GI: Abd soft, non-tender. Pos BS x 4 MS: Extremities nontender, no edema, normal ROM Neurologic: Alert and oriented x  4.  GU: Neg CVAT.  PELVIC EXAM: Declined by pt    LAB RESULTS Results for orders placed or performed during the hospital encounter of 04/27/17 (from the past 24 hour(s))  Urinalysis, Routine w reflex microscopic     Status: Abnormal   Collection Time: 04/27/17  7:10 AM  Result Value Ref Range   Color, Urine STRAW (A) YELLOW   APPearance CLEAR CLEAR   Specific Gravity, Urine 1.010 1.005 - 1.030   pH 8.0 5.0 - 8.0   Glucose, UA NEGATIVE NEGATIVE mg/dL   Hgb urine dipstick NEGATIVE NEGATIVE   Bilirubin Urine NEGATIVE NEGATIVE   Ketones, ur NEGATIVE NEGATIVE mg/dL   Protein, ur NEGATIVE NEGATIVE mg/dL   Nitrite NEGATIVE NEGATIVE   Leukocytes, UA NEGATIVE NEGATIVE  Pregnancy, urine POC     Status: None   Collection Time: 04/27/17  7:40 AM  Result Value Ref Range   Preg Test, Ur NEGATIVE NEGATIVE  hCG, serum, qualitative     Status: None   Collection Time: 04/27/17  9:12 AM  Result Value Ref Range   Preg, Serum NEGATIVE NEGATIVE       IMAGING No results found.  MAU Management/MDM: Ordered labs and reviewed results.  Pt most concerned with pregnancy, reports pain and other symptoms are mild.  UPT negative and serum pregnancy test negative.  Pt to f/u with fertility specialist if she desires tubal reversal.  Return to MAU for gyn emergencies.   ASSESSMENT 1. Negative pregnancy test   2. Abnormal uterine bleeding (AUB)     PLAN Discharge home Allergies as of 04/27/2017   No Known Allergies     Medication List    STOP taking these medications   metroNIDAZOLE 500 MG tablet Commonly known as:  FLAGYL     TAKE these medications   OVER THE COUNTER MEDICATION Take 2 capsules by mouth 2 (two) times daily. Bladderwrack and sea moss herbal supplement      Follow-up Information    [facility]   Specialty:  Obstetrics and Gynecology Why:  For routine Gyn care or if irregular periods continue. Return to MAU as needed for  emergencies. Contact information: [address] [place] [id number] [phone]          Sharen CounterLisa Leftwich-Kirby Certified Nurse-Midwife 04/27/2017  8:37 PM

## 2017-04-27 NOTE — MAU Note (Signed)
Patient presents to MAU with concerns may be pregnant, but hasn't taken a HPT.  Had a tubal ligation in May 2015 and "typically have very regular periods."  LMP 03/10/17, then had some spotting/possible period for 3 days 04/02/17.  No bleeding since then. Now c/o lower back pain, some LLQ sharp abdominal pain, "mood swings, fatigue, and I pee constantly."  States having some "white/clear stringy discharge."

## 2017-09-17 ENCOUNTER — Other Ambulatory Visit: Payer: No Typology Code available for payment source

## 2017-09-18 ENCOUNTER — Ambulatory Visit
Admission: RE | Admit: 2017-09-18 | Discharge: 2017-09-18 | Disposition: A | Discharge status: Home / Self Care | Payer: No Typology Code available for payment source | Source: Ambulatory Visit | Attending: Obstetrics and Gynecology | Admitting: Obstetrics and Gynecology

## 2017-09-18 ENCOUNTER — Other Ambulatory Visit (HOSPITAL_COMMUNITY): Payer: Self-pay | Admitting: Obstetrics and Gynecology

## 2017-09-18 DIAGNOSIS — N6310 Unspecified lump in the right breast, unspecified quadrant: Secondary | ICD-10-CM

## 2017-10-16 ENCOUNTER — Other Ambulatory Visit: Payer: No Typology Code available for payment source

## 2017-10-20 ENCOUNTER — Other Ambulatory Visit: Payer: Medicaid Other

## 2017-10-20 ENCOUNTER — Other Ambulatory Visit (HOSPITAL_COMMUNITY)
Admission: RE | Admit: 2017-10-20 | Discharge: 2017-10-20 | Disposition: A | Discharge status: Home / Self Care | Payer: Medicaid Other | Source: Ambulatory Visit | Attending: Obstetrics and Gynecology | Admitting: Obstetrics and Gynecology

## 2017-10-20 VITALS — BP 105/76 | HR 72

## 2017-10-20 DIAGNOSIS — A5901 Trichomonal vulvovaginitis: Secondary | ICD-10-CM | POA: Insufficient documentation

## 2017-10-20 DIAGNOSIS — Z202 Contact with and (suspected) exposure to infections with a predominantly sexual mode of transmission: Secondary | ICD-10-CM

## 2017-10-20 NOTE — Progress Notes (Signed)
I have reviewed the chart and agree with nursing staff's documentation of this patient's encounter.  Jaynie CollinsUgonna Anyanwu, MD 10/20/2017 3:11 PM

## 2017-10-20 NOTE — Progress Notes (Signed)
SUBJECTIVE:  27 y.o. female complains of vaginal discharge for a couple of days. Denies abnormal vaginal bleeding or significant pelvic pain or fever. No UTI symptoms. Denies history of known exposure to STD.  No LMP recorded.  OBJECTIVE:  She appears well, afebrile. Urine dipstick:not done today  ASSESSMENT:  Vaginal Discharge yes  Vaginal Odor small amount    PLAN:  GC, chlamydia, trichomonas, BVAG, CVAG probe sent to lab. Treatment: To be determined once lab results are received ROV prn if symptoms persist or worsen.

## 2017-10-21 ENCOUNTER — Encounter: Payer: Self-pay | Admitting: Obstetrics & Gynecology

## 2017-10-21 ENCOUNTER — Other Ambulatory Visit: Payer: Self-pay | Admitting: Obstetrics & Gynecology

## 2017-10-21 DIAGNOSIS — A5901 Trichomonal vulvovaginitis: Secondary | ICD-10-CM | POA: Insufficient documentation

## 2017-10-21 LAB — CERVICOVAGINAL ANCILLARY ONLY
Bacterial vaginitis: NEGATIVE
CANDIDA VAGINITIS: NEGATIVE
CHLAMYDIA, DNA PROBE: NEGATIVE
NEISSERIA GONORRHEA: NEGATIVE
TRICH (WINDOWPATH): POSITIVE — AB

## 2017-10-21 LAB — HEPATITIS B SURFACE ANTIBODY,QUALITATIVE: Hep B Surface Ab, Qual: REACTIVE

## 2017-10-21 LAB — HIV ANTIBODY (ROUTINE TESTING W REFLEX): HIV Screen 4th Generation wRfx: NONREACTIVE

## 2017-10-21 LAB — RPR: RPR Ser Ql: NONREACTIVE

## 2017-10-21 LAB — HEPATITIS C ANTIBODY: Hep C Virus Ab: <0.1 s/co ratio (ref 0.0–0.9)

## 2017-10-21 MED ORDER — METRONIDAZOLE 500 MG PO TABS: ORAL_TABLET | ORAL | 1 refills | Status: DC

## 2017-10-21 NOTE — Progress Notes (Signed)
Patient has Trichomonas infection which is a STI.  She had negative testing for other STIs,  but she needs to let partner(s) know so the partner(s) can get testing and treatment. Patient and sex partner(s) should abstain from unprotected sexual activity for at least seven days after everyone receives appropriate treatment.  Metronidazole was prescribed for patient.  Patient will need to return in about 4 weeks after treatment for repeat test of cure.  Please call to inform patient of results and recommendations, and advise to pick up prescription.

## 2018-02-23 ENCOUNTER — Ambulatory Visit (INDEPENDENT_AMBULATORY_CARE_PROVIDER_SITE_OTHER): Payer: Medicaid Other | Admitting: Obstetrics & Gynecology

## 2018-02-23 ENCOUNTER — Other Ambulatory Visit (HOSPITAL_COMMUNITY)
Admission: RE | Admit: 2018-02-23 | Discharge: 2018-02-23 | Disposition: A | Discharge status: Home / Self Care | Payer: Medicaid Other | Source: Ambulatory Visit | Attending: Obstetrics & Gynecology | Admitting: Obstetrics & Gynecology

## 2018-02-23 VITALS — BP 132/82 | HR 72

## 2018-02-23 DIAGNOSIS — B9689 Other specified bacterial agents as the cause of diseases classified elsewhere: Secondary | ICD-10-CM

## 2018-02-23 DIAGNOSIS — N898 Other specified noninflammatory disorders of vagina: Secondary | ICD-10-CM | POA: Insufficient documentation

## 2018-02-23 DIAGNOSIS — N760 Acute vaginitis: Secondary | ICD-10-CM

## 2018-02-23 DIAGNOSIS — A599 Trichomoniasis, unspecified: Secondary | ICD-10-CM

## 2018-02-23 NOTE — Progress Notes (Signed)
SUBJECTIVE:  27 y.o. female complains of  vaginal discharge for a couple of weeks.   Denies abnormal vaginal bleeding or significant pelvic pain or fever. No UTI symptoms. Denies history of known exposure to STD.  No LMP recorded.  OBJECTIVE:  She appears well, afebrile. Urine dipstick: not done   ASSESSMENT:  Vaginal Discharge small amount  Vaginal Odor none   PLAN:  GC, chlamydia, trichomonas, BVAG, CVAG probe sent to lab. Treatment: To be determined once lab results are received ROV prn if symptoms persist or worsen.

## 2018-02-24 LAB — CERVICOVAGINAL ANCILLARY ONLY
Bacterial vaginitis: POSITIVE — AB
CANDIDA VAGINITIS: NEGATIVE
CHLAMYDIA, DNA PROBE: NEGATIVE
NEISSERIA GONORRHEA: NEGATIVE
TRICH (WINDOWPATH): POSITIVE — AB

## 2018-02-25 ENCOUNTER — Telehealth: Payer: Self-pay

## 2018-02-25 ENCOUNTER — Encounter: Payer: Self-pay | Admitting: Obstetrics & Gynecology

## 2018-02-25 MED ORDER — METRONIDAZOLE 500 MG PO TABS: 500.0000 mg | ORAL_TABLET | Freq: Two times a day (BID) | ORAL | 1 refills | Status: DC

## 2018-02-25 NOTE — Telephone Encounter (Signed)
Informed patient of sti (trich) infection and to refrain from having intercourse until her and partner have been treated. Patient will also need to be retested in four weeks to make sure the infection has cleared up. Patient voice understanding at this time.

## 2018-02-25 NOTE — Telephone Encounter (Signed)
-----   Message from Tereso Newcomer, MD sent at 02/25/2018  8:17 AM EDT ----- Patient has still has Trichomonas infection, had this last in June 2019.  She had negative testing for other STIs at that time.  Worried that her partner is not getting appropriately treated, or that they are just getting re-infected from the original source.  Patient needs to let partner(s) know so the partner(s) can get testing and appropriate treatment; also recommend repeat comprehensive STI testing for patient. Patient and sex partner(s) should abstain from unprotected sexual activity for two weeks after everyone receives appropriate treatment.  Extended Metronidazole regimen was prescribed for patient; this can result in a more effective treatment than the single dose therapy and also will treat the bacterial vaginitis.  Patient will need to return in about 4 weeks after treatment for repeat test of cure.  Please call to inform patient of results and recommendations, and advise to pick up prescription.   This note was also released to MyChart.  [name], MD

## 2018-02-25 NOTE — Progress Notes (Signed)
I have reviewed the chart and agree with nursing staff's documentation of this patient's encounter on 02/23/2018.  Results reviewed. Patient has still has Trichomonas vaginalis infection, had this last in June 2019.  She had negative testing for other STIs at that time.  Worried that her partner is not getting appropriately treated, or that they are just getting re-infected from the original source.  Patientneeds to let partner(s) know so the partner(s) can get testing and appropriate treatment; also recommend repeat comprehensive STI testing for patient. Patient and sex partner(s) should abstain from unprotected sexual activity for two weeks after everyone receives appropriate treatment.  Extended Metronidazole regimen was prescribed for patient; this can result in a more effective treatment than the single dose therapy and also will treat the bacterial vaginitis.  Patient will need to return in about 4 weeks after treatment for repeat test of cure.  Please call to inform patient of results and recommendations, and advise to pick up prescription.   This note was also released to MyChart.   [name], MD 02/25/2018 8:16 AM

## 2018-02-25 NOTE — Addendum Note (Signed)
Addended by: [name] A on: 02/25/2018 08:17 AM   Modules accepted: Orders

## 2018-03-20 NOTE — Progress Notes (Signed)
SUBJECTIVE:  27 y.o. female present to the office today for a TOC. Patient tested positive for Trich and was inform to come back to the clinic for a test of cure.She complains of white discharge today. No abnormal vaginal bleeding or significant pelvic pain or fever. No UTI symptoms. Denies history of known exposure to STD.  No LMP recorded.  OBJECTIVE:  She appears well, afebrile. Urine dipstick: not done   ASSESSMENT:  Vaginal Discharge small amount Vaginal Odor small amount   PLAN:  GC, chlamydia, trichomonas, BVAG, CVAG probe sent to lab. Treatment: To be determined once lab results are received ROV prn if symptoms persist or worsen.

## 2018-03-23 ENCOUNTER — Ambulatory Visit: Payer: Medicaid Other

## 2018-03-23 ENCOUNTER — Other Ambulatory Visit: Payer: Self-pay

## 2018-03-23 ENCOUNTER — Other Ambulatory Visit (HOSPITAL_COMMUNITY)
Admission: RE | Admit: 2018-03-23 | Discharge: 2018-03-23 | Disposition: A | Discharge status: Home / Self Care | Payer: Medicaid Other | Source: Ambulatory Visit | Attending: Obstetrics & Gynecology | Admitting: Obstetrics & Gynecology

## 2018-03-23 VITALS — BP 116/71 | HR 72

## 2018-03-23 DIAGNOSIS — N898 Other specified noninflammatory disorders of vagina: Secondary | ICD-10-CM | POA: Insufficient documentation

## 2018-03-24 LAB — CERVICOVAGINAL ANCILLARY ONLY
BACTERIAL VAGINITIS: POSITIVE — AB
CANDIDA VAGINITIS: NEGATIVE
Chlamydia: NEGATIVE
NEISSERIA GONORRHEA: NEGATIVE
Trichomonas: NEGATIVE

## 2018-03-25 ENCOUNTER — Telehealth: Payer: Self-pay

## 2018-03-25 ENCOUNTER — Other Ambulatory Visit: Payer: Self-pay | Admitting: Obstetrics & Gynecology

## 2018-03-25 DIAGNOSIS — A599 Trichomoniasis, unspecified: Secondary | ICD-10-CM

## 2018-03-25 DIAGNOSIS — N760 Acute vaginitis: Secondary | ICD-10-CM

## 2018-03-25 DIAGNOSIS — B9689 Other specified bacterial agents as the cause of diseases classified elsewhere: Secondary | ICD-10-CM

## 2018-03-25 MED ORDER — METRONIDAZOLE 500 MG PO TABS: 500.0000 mg | ORAL_TABLET | Freq: Two times a day (BID) | ORAL | 1 refills | Status: DC

## 2018-03-25 NOTE — Telephone Encounter (Signed)
-----   Message from Tereso NewcomerUgonna A Anyanwu, MD sent at 03/25/2018 11:06 AM EST ----- Vaginal discharge test is abnormal and showed bacterial vaginitis. Metronidazole was prescribed. Negative test of cure for trichomonas, which is reassuring.  If bacterial vaginitis persists, may need extended metronidazole therapy. Recommend reevaluation in 3-4 weeks (please schedule RN visit for patient to repeat self-swab).  Patient was informed via MyChart and advised to pick up prescription.

## 2018-03-25 NOTE — Telephone Encounter (Signed)
Call patient and left a message concerning test results and the need to be treated with Flagyl. No answer or voice mail to leave a message. Results were sent via my chart.

## 2018-05-14 ENCOUNTER — Other Ambulatory Visit (HOSPITAL_COMMUNITY)
Admission: RE | Admit: 2018-05-14 | Discharge: 2018-05-14 | Disposition: A | Discharge status: Home / Self Care | Payer: Medicaid Other | Source: Ambulatory Visit | Attending: Obstetrics and Gynecology | Admitting: Obstetrics and Gynecology

## 2018-05-14 ENCOUNTER — Other Ambulatory Visit: Payer: Medicaid Other

## 2018-05-14 VITALS — BP 116/64 | HR 72 | Wt 102.4 lb

## 2018-05-14 DIAGNOSIS — N760 Acute vaginitis: Secondary | ICD-10-CM

## 2018-05-14 DIAGNOSIS — N898 Other specified noninflammatory disorders of vagina: Secondary | ICD-10-CM

## 2018-05-14 DIAGNOSIS — B9689 Other specified bacterial agents as the cause of diseases classified elsewhere: Secondary | ICD-10-CM

## 2018-05-14 NOTE — Progress Notes (Signed)
SUBJECTIVE:  28 y.o. female complains of vaginal discharge for a couple of days. Denies abnormal vaginal bleeding or significant pelvic pain or fever. No UTI symptoms. Denies history of known exposure to STD.  No LMP recorded. 04/26/2018  OBJECTIVE:  She appears well, afebrile. Urine dipstick: not done today  ASSESSMENT:  Vaginal Discharge: small amount  Vaginal Odor:   PLAN:  GC, chlamydia, trichomonas, BVAG, CVAG probe sent to lab. Treatment: To be determined once lab results are received ROV prn if symptoms persist or worsen.

## 2018-05-15 LAB — CERVICOVAGINAL ANCILLARY ONLY
Bacterial vaginitis: POSITIVE — AB
CHLAMYDIA, DNA PROBE: NEGATIVE
NEISSERIA GONORRHEA: NEGATIVE
TRICH (WINDOWPATH): NEGATIVE

## 2018-05-19 MED ORDER — METRONIDAZOLE 500 MG PO TABS: 500.0000 mg | ORAL_TABLET | Freq: Two times a day (BID) | ORAL | 1 refills | Status: DC

## 2018-05-19 NOTE — Addendum Note (Signed)
Addended by: [place] Bing on: 05/19/2018 08:22 AM   Modules accepted: Orders

## 2018-05-20 ENCOUNTER — Other Ambulatory Visit: Payer: Self-pay | Admitting: Family Medicine

## 2018-05-20 ENCOUNTER — Telehealth: Payer: Self-pay | Admitting: *Deleted

## 2018-05-20 DIAGNOSIS — N760 Acute vaginitis: Secondary | ICD-10-CM

## 2018-05-20 MED ORDER — BORIC ACID CRYS: 600.0000 mg | CRYSTALS | Freq: Every day | 5 refills | Status: DC

## 2018-05-20 NOTE — Telephone Encounter (Signed)
Pt requesting a written prescription of Boric acid as she had taken in the past. Told patient I would ask provider and we would call her when RX is ready to be picked up.   Scheryl Marten, RN

## 2018-05-20 NOTE — Progress Notes (Signed)
Patient requests boric acid for recurrent BV

## 2018-12-16 ENCOUNTER — Other Ambulatory Visit: Payer: Self-pay

## 2018-12-16 ENCOUNTER — Encounter: Payer: Self-pay | Admitting: Family Medicine

## 2018-12-16 ENCOUNTER — Ambulatory Visit (INDEPENDENT_AMBULATORY_CARE_PROVIDER_SITE_OTHER): Payer: Medicaid Other | Admitting: Family Medicine

## 2018-12-16 VITALS — BP 113/80 | HR 105 | Wt 106.0 lb

## 2018-12-16 DIAGNOSIS — N926 Irregular menstruation, unspecified: Secondary | ICD-10-CM | POA: Insufficient documentation

## 2018-12-16 DIAGNOSIS — Z803 Family history of malignant neoplasm of breast: Secondary | ICD-10-CM | POA: Insufficient documentation

## 2018-12-16 DIAGNOSIS — N6452 Nipple discharge: Secondary | ICD-10-CM

## 2018-12-16 DIAGNOSIS — N6312 Unspecified lump in the right breast, upper inner quadrant: Secondary | ICD-10-CM | POA: Diagnosis not present

## 2018-12-16 DIAGNOSIS — Z3202 Encounter for pregnancy test, result negative: Secondary | ICD-10-CM | POA: Diagnosis not present

## 2018-12-16 LAB — POCT URINE PREGNANCY: Preg Test, Ur: NEGATIVE

## 2018-12-16 NOTE — Progress Notes (Signed)
In July pt had two cycles, pt is usually very regular Her August cycle is possibly late Had a BTL in 2015 Now having left side lower back pain

## 2018-12-16 NOTE — Patient Instructions (Signed)
Dysfunctional Uterine Bleeding Dysfunctional uterine bleeding is abnormal bleeding from the uterus. Dysfunctional uterine bleeding includes:  A menstrual period that comes earlier or later than usual.  A menstrual period that is lighter or heavier than usual, or has large blood clots.  Vaginal bleeding between menstrual periods.  Skipping one or more menstrual periods.  Vaginal bleeding after sex.  Vaginal bleeding after menopause. Follow these instructions at home: Eating and drinking   Eat well-balanced meals. Include foods that are high in iron, such as liver, meat, shellfish, green leafy vegetables, and eggs.  To prevent or treat constipation, your health care provider may recommend that you: ? Drink enough fluid to keep your urine pale yellow. ? Take over-the-counter or prescription medicines. ? Eat foods that are high in fiber, such as beans, whole grains, and fresh fruits and vegetables. ? Limit foods that are high in fat and processed sugars, such as fried or sweet foods. Medicines  Take over-the-counter and prescription medicines only as told by your health care provider.  Do not change medicines without talking with your health care provider.  Aspirin or medicines that contain aspirin may make the bleeding worse. Do not take those medicines: ? During the week before your menstrual period. ? During your menstrual period.  If you were prescribed iron pills, take them as told by your health care provider. Iron pills help to replace iron that your body loses because of this condition. Activity  If you need to change your sanitary pad or tampon more than one time every 2 hours: ? Lie in bed with your feet raised (elevated). ? Place a cold pack on your lower abdomen. ? Rest as much as possible until the bleeding stops or slows down.  Do not try to lose weight until the bleeding has stopped and your blood iron level is back to normal. General instructions   For two  months, write down: ? When your menstrual period starts. ? When your menstrual period ends. ? When any abnormal vaginal bleeding occurs. ? What problems you notice.  Keep all follow up visits as told by your health care provider. This is important. Contact a health care provider if you:  Feel light-headed or weak.  Have nausea and vomiting.  Cannot eat or drink without vomiting.  Feel dizzy or have diarrhea while you are taking medicines.  Are taking birth control pills or hormones, and you want to change them or stop taking them. Get help right away if:  You develop a fever or chills.  You need to change your sanitary pad or tampon more than one time per hour.  Your vaginal bleeding becomes heavier, or your flow contains clots more often.  You develop pain in your abdomen.  You lose consciousness.  You develop a rash. Summary  Dysfunctional uterine bleeding is abnormal bleeding from the uterus.  It includes menstrual bleeding of abnormal duration, volume, or regularity.  Bleeding after sex and after menopause are also considered dysfunctional uterine bleeding. This information is not intended to replace advice given to you by your health care provider. Make sure you discuss any questions you have with your health care provider. Document Released: 04/19/2000 Document Revised: 10/01/2017 Document Reviewed: 10/01/2017 Elsevier Patient Education  2020 Elsevier Inc.  

## 2018-12-16 NOTE — Assessment & Plan Note (Signed)
Unclear why they are irregular--check TSH

## 2018-12-16 NOTE — Progress Notes (Signed)
   Subjective:    Patient ID: Jeanne Stark is a 28 y.o. female presenting with Menstrual Problem  on 12/16/2018  HPI: 2 cycles on in July x 14 days. Woke up this am with left sided back pain. Regular cycle x 4 days 7/12. Then second cycle 7/25 lasted 3 days. Usually cycles are 25-27 days apart. Noticed some ankle swelling. Having some frequent urination. Having more headaches. Worries her hormones are out of wack. She is s/p BTL. Noted clear fluid from her breast on left. Mom and grandmother have breast cancer in their 24s. Has abnormal breast u/s and should have had f/u 15 months ago. Having some cramping on 8/9, but no cycle yet. UPT negative at home. Having nausea on 8/10. Working at [employer]. Has had some fatigue but no other symptoms of COVID.   Review of Systems  Constitutional: Negative for chills and fever.  Respiratory: Negative for shortness of breath.   Cardiovascular: Negative for chest pain.  Gastrointestinal: Positive for nausea. Negative for abdominal pain and vomiting.  Genitourinary: Positive for vaginal bleeding (irregular). Negative for dysuria.  Musculoskeletal: Positive for back pain.  Skin: Negative for rash.      Objective:    BP 113/80   Pulse (!) 105   Wt 106 lb (48.1 kg)   LMP 11/15/2018   BMI 19.39 kg/m  Physical Exam Constitutional:      General: She is not in acute distress.    Appearance: She is well-developed.  HENT:     Head: Normocephalic and atraumatic.  Eyes:     General: No scleral icterus. Neck:     Musculoskeletal: Neck supple.  Cardiovascular:     Rate and Rhythm: Normal rate.  Pulmonary:     Effort: Pulmonary effort is normal.  Abdominal:     Palpations: Abdomen is soft.  Skin:    General: Skin is warm and dry.  Neurological:     Mental Status: She is alert and oriented to person, place, and time.         Assessment & Plan:   Problem List Items Addressed This Visit      Unprioritized   Family history of breast  cancer    Check bilateral u/s--right with abnormal finding needing f/u Left with new nipple dc. Genetic breast cancer gene screening.      Relevant Orders   Genetic Screening   Irregular menses - Primary    Unclear why they are irregular--check TSH      Relevant Orders   POCT urine pregnancy (Completed)   TSH    Other Visit Diagnoses    Breast lump on right side at 1 o'clock position       Relevant Orders   US BREAST BILATERAL   Nipple discharge       Relevant Orders   US BREAST BILATERAL      Total face-to-face time with patient: 15 minutes. Over 50% of encounter was spent on counseling and coordination of care. Return if symptoms worsen or fail to improve.  Donnamae Jude 12/16/2018 1:29 PM

## 2018-12-16 NOTE — Assessment & Plan Note (Signed)
Check bilateral u/s--right with abnormal finding needing f/u Left with new nipple dc. Genetic breast cancer gene screening.

## 2018-12-17 LAB — TSH: TSH: 0.593 u[IU]/mL (ref 0.450–4.500)

## 2018-12-29 ENCOUNTER — Telehealth: Payer: Medicaid Other | Admitting: Obstetrics and Gynecology

## 2019-01-25 ENCOUNTER — Telehealth: Payer: Self-pay | Admitting: *Deleted

## 2019-01-25 ENCOUNTER — Encounter: Payer: Self-pay | Admitting: *Deleted

## 2019-01-25 NOTE — Telephone Encounter (Signed)
Pt informed of [facility] results 

## 2019-02-22 ENCOUNTER — Other Ambulatory Visit: Payer: Self-pay | Admitting: Family Medicine

## 2019-02-22 DIAGNOSIS — N6310 Unspecified lump in the right breast, unspecified quadrant: Secondary | ICD-10-CM

## 2019-02-22 DIAGNOSIS — N6320 Unspecified lump in the left breast, unspecified quadrant: Secondary | ICD-10-CM

## 2019-02-23 ENCOUNTER — Other Ambulatory Visit: Payer: Self-pay | Admitting: Family Medicine

## 2019-02-23 DIAGNOSIS — N6452 Nipple discharge: Secondary | ICD-10-CM

## 2019-03-02 ENCOUNTER — Ambulatory Visit
Admission: RE | Admit: 2019-03-02 | Discharge: 2019-03-02 | Disposition: A | Discharge status: Home / Self Care | Payer: Medicaid Other | Source: Ambulatory Visit | Attending: Family Medicine | Admitting: Family Medicine

## 2019-03-02 ENCOUNTER — Other Ambulatory Visit: Payer: Self-pay

## 2019-03-02 DIAGNOSIS — N6452 Nipple discharge: Secondary | ICD-10-CM

## 2019-03-02 DIAGNOSIS — N6310 Unspecified lump in the right breast, unspecified quadrant: Secondary | ICD-10-CM

## 2019-04-06 ENCOUNTER — Telehealth: Payer: Self-pay

## 2019-04-06 DIAGNOSIS — F419 Anxiety disorder, unspecified: Secondary | ICD-10-CM

## 2019-04-06 DIAGNOSIS — F329 Major depressive disorder, single episode, unspecified: Secondary | ICD-10-CM

## 2019-04-06 NOTE — Telephone Encounter (Signed)
Received call from patient she reports having some depression and anxiety for a couple of weeks. At this time she does not report any feeling of SI or wanting to hurt anyone at this time. She would like to have a referral to see Roselyn Reef the behavioral clinician at the [place] office.

## 2019-04-15 NOTE — BH Specialist Note (Signed)
Integrated Behavioral Health via Telemedicine phone Visit  04/15/2019 MONTY MCCARRELL [id number]  Number of Integrated Behavioral Health visits: 1 Session Start time: 1:18  Session End time: 2:16 Total time: 58  Referring Provider: [name], MD Type of Visit: Video Patient/Family location: Home [facility] Provider location: [facility] All persons participating in visit: Patient Jeanne Stark and [facility] Jeanne Stark    Confirmed patient's address: Yes  Confirmed patient's phone number: Yes  Any changes to demographics: No   Confirmed patient's insurance: Yes  Any changes to patient's insurance: No   Discussed confidentiality: Yes   I connected with Jeanne Stark  by a video enabled telemedicine application and verified that I am speaking with the correct person using two identifiers.     I discussed the limitations of evaluation and management by telemedicine and the availability of in person appointments.  I discussed that the purpose of this visit is to provide behavioral health care while limiting exposure to the novel coronavirus.   Discussed there is a possibility of technology failure and discussed alternative modes of communication if that failure occurs.  I discussed that engaging in this phone visit, they consent to the provision of behavioral healthcare and the services will be billed under their insurance.  Patient and/or legal guardian expressed understanding and consented to phone visit: Yes   PRESENTING CONCERNS: Patient and/or family reports the following symptoms/concerns: Pt states her primary concern is feeling stressed, overwhelmed, anxious, and depressed for years; increased since loss of her mother. Pt prefers non-pharmacological coping (meditation, herbal medicine; cannabis to sleep).  Duration of problem: Ongoing, with worsening symptoms after loss; Severity of problem: moderate  STRENGTHS (Protective Factors/Coping Skills): Self-awareness; open to  learning additional self-coping strategies  GOALS ADDRESSED: Patient will: 1.  Reduce symptoms of: anxiety, depression and stress  2.  Increase knowledge and/or ability of: healthy habits and self-management skills  3.  Demonstrate ability to: Increase healthy adjustment to current life circumstances and Increase adequate support systems for patient/family  INTERVENTIONS: Interventions utilized:  Mindfulness or Management consultant, Psychoeducation and/or Health Education and Link to [facility] Standardized Assessments completed: GAD-7 and PHQ 9  ASSESSMENT: Patient currently experiencing Grief.   Patient may benefit from psychoeducation and brief therapeutic interventions regarding coping with symptoms of anxiety and depression related to current grief .  PLAN: 1. Follow up with behavioral health clinician on : Two weeks 2. Behavioral recommendations:  -Continue using healthy self-coping strategies that have helped in the past  -CALM relaxation breathing exercise twice daily(morning; at bedtime); consider using with daughters as needed -Consider Stop, Breathe, Think app for self (and kid version for children), as needed throughout the day -Consider Grief counseling, as discussed 3. Referral(s): Integrated Art gallery manager (In Clinic) and [facility]:  grief support  I discussed the assessment and treatment plan with the patient and/or parent/guardian. They were provided an opportunity to ask questions and all were answered. They agreed with the plan and demonstrated an understanding of the instructions.   They were advised to call back or seek an in-person evaluation if the symptoms worsen or if the condition fails to improve as anticipated.  Jeanne Stark  Depression screen [facility] 2/9 04/21/2019  Decreased Interest 0  Down, Depressed, Hopeless 0  PHQ - 2 Score 0  Altered sleeping 0  Tired, decreased energy 0  Change in appetite 0  Feeling bad or  failure about yourself  0  Trouble concentrating 0  Moving slowly or fidgety/restless 0  Suicidal thoughts 0  PHQ-9 Score 0   GAD 7 : Generalized Anxiety Score 04/21/2019  Nervous, Anxious, on Edge 1  Control/stop worrying 0  Worry too much - different things 1  Trouble relaxing 1  Restless 0  Easily annoyed or irritable 2  Afraid - awful might happen 0  Total GAD 7 Score 5

## 2019-04-21 ENCOUNTER — Ambulatory Visit (INDEPENDENT_AMBULATORY_CARE_PROVIDER_SITE_OTHER): Payer: Medicaid Other | Admitting: Clinical

## 2019-04-21 ENCOUNTER — Other Ambulatory Visit: Payer: Self-pay

## 2019-04-21 DIAGNOSIS — F4321 Adjustment disorder with depressed mood: Secondary | ICD-10-CM

## 2019-04-21 NOTE — Patient Instructions (Signed)

## 2019-04-22 NOTE — BH Specialist Note (Signed)
Integrated Behavioral Health via Telemedicine Video Visit  04/22/2019 CALYNN FERRERO [id number]  Number of Integrated Behavioral Health visits: 2 Session Start time: 3:30 Session End time: 3:50 Total time: 20  Referring Provider: Verita Schneiders, MD Type of Visit: Video Patient/Family location: Home [facility] Provider location: [facility] All persons participating in visit: Patient [place] and [place]    Confirmed patient's address: Yes  Confirmed patient's phone number: Yes  Any changes to demographics: No   Confirmed patient's insurance: Yes  Any changes to patient's insurance: No   Discussed confidentiality: At previous visit  I connected with [place]  by a video enabled telemedicine application and verified that I am speaking with the correct person using two identifiers.     I discussed the limitations of evaluation and management by telemedicine and the availability of in person appointments.  I discussed that the purpose of this visit is to provide behavioral health care while limiting exposure to the novel coronavirus.   Discussed there is a possibility of technology failure and discussed alternative modes of communication if that failure occurs.  I discussed that engaging in this video visit, they consent to the provision of behavioral healthcare and the services will be billed under their insurance.  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  Duration of problem: Ongoing, increase symptoms after loss of mother; Severity of problem: moderate  STRENGTHS (Protective Factors/Coping  Skills): Self-awareness;daily meditation, future oriented outlook  GOALS ADDRESSED: Patient will: 1.  Reduce symptoms of: anxiety, compulsions and stress  2.  Demonstrate ability to: Increase healthy adjustment to current life circumstances and Continue healthy grieving over loss  INTERVENTIONS: Interventions utilized:  Supportive Counseling Standardized Assessments completed: Not Needed  ASSESSMENT: Patient currently experiencing Grief.   Patient may benefit from continued psychoeducation and brief therapeutic interventions regarding coping with symptoms of grief .  PLAN: 1. Follow up with behavioral health clinician on : As needed 2. Behavioral recommendations:  -Continue using healthy self-coping strategies that are helping to cope with loss(meditation, singing bowl, prioritizing achievable goals, etc.) -Continue to consider grief counseling resources for self or children as needed in the future 3. Referral(s): [place] (In Clinic)  I discussed the assessment and treatment plan with the patient and/or parent/guardian. They were provided an opportunity to ask questions and all were answered. They agreed with the plan and demonstrated an understanding of the instructions.   They were advised to call back or seek an in-person evaluation if the symptoms worsen or if the condition fails to improve as anticipated.  Caroleen Hamman Starletta Houchin

## 2019-05-05 ENCOUNTER — Other Ambulatory Visit: Payer: Self-pay

## 2019-05-05 ENCOUNTER — Ambulatory Visit (INDEPENDENT_AMBULATORY_CARE_PROVIDER_SITE_OTHER): Payer: Medicaid Other | Admitting: Clinical

## 2019-05-05 DIAGNOSIS — F4321 Adjustment disorder with depressed mood: Secondary | ICD-10-CM

## 2021-01-09 ENCOUNTER — Ambulatory Visit: Payer: Medicaid Other | Admitting: Obstetrics and Gynecology

## 2021-01-29 ENCOUNTER — Ambulatory Visit: Payer: Medicaid Other | Admitting: Obstetrics and Gynecology

## 2021-02-16 ENCOUNTER — Other Ambulatory Visit (HOSPITAL_COMMUNITY)
Admission: RE | Admit: 2021-02-16 | Discharge: 2021-02-16 | Disposition: A | Discharge status: Home / Self Care | Payer: Medicaid Other | Source: Ambulatory Visit | Attending: Obstetrics & Gynecology | Admitting: Obstetrics & Gynecology

## 2021-02-16 ENCOUNTER — Ambulatory Visit (INDEPENDENT_AMBULATORY_CARE_PROVIDER_SITE_OTHER): Payer: Medicaid Other | Admitting: *Deleted

## 2021-02-16 ENCOUNTER — Other Ambulatory Visit: Payer: Self-pay

## 2021-02-16 VITALS — BP 99/64 | HR 76

## 2021-02-16 DIAGNOSIS — Z113 Encounter for screening for infections with a predominantly sexual mode of transmission: Secondary | ICD-10-CM | POA: Diagnosis present

## 2021-02-16 NOTE — Progress Notes (Signed)
Patient was assessed and managed by nursing staff during this encounter. I have reviewed the chart and agree with the documentation and plan.   [name], MD 02/16/2021 9:24 AM

## 2021-02-16 NOTE — Progress Notes (Signed)
SUBJECTIVE:  30 y.o. female who desires a STI screen. Denies abnormal vaginal discharge, bleeding or significant pelvic pain. No UTI symptoms. Denies history of known exposure to STD.  No LMP recorded.  OBJECTIVE:  She appears well.   ASSESSMENT:  STI Screen   PLAN:  Pt offered STI blood screening- requested but no lab tech today, will have them drawn at her annual coming up. GC, chlamydia, and trichomonas probe sent to lab.  Treatment: To be determined once lab results are received.  Pt follow up as needed.

## 2021-02-19 LAB — CERVICOVAGINAL ANCILLARY ONLY
Chlamydia: NEGATIVE
Comment: NEGATIVE
Comment: NEGATIVE
Comment: NORMAL
Neisseria Gonorrhea: NEGATIVE
Trichomonas: NEGATIVE

## 2021-03-06 ENCOUNTER — Ambulatory Visit: Payer: Medicaid Other | Admitting: Obstetrics and Gynecology

## 2021-03-13 ENCOUNTER — Other Ambulatory Visit (HOSPITAL_COMMUNITY)
Admission: RE | Admit: 2021-03-13 | Discharge: 2021-03-13 | Disposition: A | Discharge status: Home / Self Care | Payer: Medicaid Other | Source: Ambulatory Visit | Attending: Obstetrics & Gynecology | Admitting: Obstetrics & Gynecology

## 2021-03-13 ENCOUNTER — Ambulatory Visit (INDEPENDENT_AMBULATORY_CARE_PROVIDER_SITE_OTHER): Payer: Medicaid Other | Admitting: Obstetrics & Gynecology

## 2021-03-13 ENCOUNTER — Other Ambulatory Visit: Payer: Self-pay

## 2021-03-13 ENCOUNTER — Encounter: Payer: Self-pay | Admitting: Obstetrics & Gynecology

## 2021-03-13 VITALS — BP 109/75 | HR 98 | Wt 110.0 lb

## 2021-03-13 DIAGNOSIS — Z113 Encounter for screening for infections with a predominantly sexual mode of transmission: Secondary | ICD-10-CM

## 2021-03-13 DIAGNOSIS — Z803 Family history of malignant neoplasm of breast: Secondary | ICD-10-CM

## 2021-03-13 DIAGNOSIS — N63 Unspecified lump in unspecified breast: Secondary | ICD-10-CM | POA: Diagnosis not present

## 2021-03-13 DIAGNOSIS — Z01419 Encounter for gynecological examination (general) (routine) without abnormal findings: Secondary | ICD-10-CM

## 2021-03-13 DIAGNOSIS — N644 Mastodynia: Secondary | ICD-10-CM | POA: Diagnosis not present

## 2021-03-13 NOTE — Progress Notes (Signed)
GYNECOLOGY ANNUAL PREVENTATIVE CARE ENCOUNTER NOTE  History:     Jeanne Stark is a 30 y.o. G52P2003 female here for a routine annual gynecologic exam.  Current complaints: continued bilateral breast pain and also feels masses.  They have been evaluated in 2020, she wants them followed up and she is also concerned about FH of breast cancer in her mother and grandmother who were both diagnosed in their 69s.  She also desires annual STI screen.  Denies abnormal vaginal bleeding, discharge, pelvic pain, problems with intercourse or other gynecologic concerns.    Gynecologic History Patient's last menstrual period was 02/25/2021. Contraception: none Last Pap: 03/12/2017. Result was normal   Obstetric History OB History  Gravida Para Term Preterm AB Living  2 2 2  0 0 3  SAB IAB Ectopic Multiple Live Births  0 0 0 1 3    # Outcome Date GA Lbr Len/2nd Weight Sex Delivery Anes PTL Lv  2A Term 07/24/13 [redacted]w[redacted]d / 00:07 5 lb 5.9 oz (2.435 kg) F Vag-Spont None  LIV  2B Term 07/24/13 [redacted]w[redacted]d / 01:15 5 lb 9.1 oz (2.525 kg) F CS-LTranv Gen  LIV  1 Term 06/27/08   6 lb 10 oz (3.005 kg) F Vag-Spont None N LIV    Past Medical History:  Diagnosis Date   Gall stones    Herpes    Hx of carpal tunnel syndrome    Preterm labor    Trichomonal vaginitis     Past Surgical History:  Procedure Laterality Date   CESAREAN SECTION N/A 07/24/2013   Procedure: Emergent CESAREAN SECTION, repair of vaginal laceration;  Surgeon: 07/26/2013, MD;  Location: [facility];  Service: Obstetrics;  Laterality: N/A;   LAPAROSCOPIC TUBAL LIGATION Bilateral 09/20/2013   Procedure: LAPAROSCOPIC TUBAL LIGATION;  Surgeon: 09/22/2013, MD;  Location: [facility];  Service: Gynecology;  Laterality: Bilateral;   NO PAST SURGERIES     TUBAL LIGATION     WISDOM TOOTH EXTRACTION      Current Outpatient Medications on File Prior to Visit  Medication Sig Dispense Refill   Boric Acid CRYS Place 600 mg vaginally at bedtime. Use  vaginally every night for two weeks then twice a week 500 g 5   No current facility-administered medications on file prior to visit.    No Known Allergies  Social History:  reports that she has never smoked. She has never used smokeless tobacco. She reports that she does not drink alcohol and does not use drugs.  Family History  Problem Relation Age of Onset   Cancer Mother    Hyperlipidemia Mother    Breast cancer Mother 68   Cancer Maternal Grandmother    COPD Maternal Grandmother        breast   Breast cancer Maternal Grandmother 63   Cancer Maternal Aunt    COPD Maternal Aunt        breast   Breast cancer Maternal Aunt     The following portions of the patient's history were reviewed and updated as appropriate: allergies, current medications, past family history, past medical history, past social history, past surgical history and problem list.  Review of Systems Pertinent items noted in HPI and remainder of comprehensive ROS otherwise negative.  Physical Exam:  BP 109/75   Pulse 98   Wt 110 lb (49.9 kg)   LMP 02/25/2021   BMI 20.12 kg/m  CONSTITUTIONAL: Well-developed, well-nourished female in no acute distress.  HENT:  Normocephalic, atraumatic, External right and  left ear normal.  EYES: Conjunctivae and EOM are normal. Pupils are equal, round, and reactive to light. No scleral icterus.  NECK: Normal range of motion, supple, no masses.  Normal thyroid.  SKIN: Skin is warm and dry. No rash noted. Not diaphoretic. No erythema. No pallor. MUSCULOSKELETAL: Normal range of motion. No tenderness.  No cyanosis, clubbing, or edema. NEUROLOGIC: Alert and oriented to person, place, and time. Normal reflexes, muscle tone coordination.  PSYCHIATRIC: Normal mood and affect. Normal behavior. Normal judgment and thought content. CARDIOVASCULAR: Normal heart rate noted, regular rhythm RESPIRATORY: Clear to auscultation bilaterally. Effort and breath sounds normal, no problems with  respiration noted. BREASTS: Symmetric in size. Bilateral small masses palpated in breast, diffuse, tender to touch.  No skin changes, nipple drainage, or lymphadenopathy bilaterally. Performed in the presence of a chaperone. ABDOMEN: Soft, no distention noted.  No tenderness, rebound or guarding.  PELVIC: Normal appearing external genitalia and urethral meatus; normal appearing vaginal mucosa and cervix.  No abnormal vaginal discharge noted.  Pap smear obtained.  Normal uterine size, no other palpable masses, no uterine or adnexal tenderness.  Performed in the presence of a chaperone.   Assessment and Plan:     1. FH: breast cancer in first degree ans second degree relatives when <26 years old 2. Breast mass in female 3. Breast tenderness in female Breast imaging ordered for evaluation. Will follow up [facility] recommendations about further screening especially given her family history. - MM DIAG BREAST TOMO BILATERAL; Future - US BREAST LTD UNI LEFT INC AXILLA; Future - US BREAST LTD UNI RIGHT INC AXILLA; Future  4. Routine screening for STI (sexually transmitted infection) STI screen done as desired, will follow up results and manage accordingly. - RPR+HBsAg+HCVAb+... - Cytology - PAP  5. Well woman exam with routine gynecological exam - Cytology - PAP Will follow up results of pap smear and manage accordingly. Routine preventative health maintenance measures emphasized. Please refer to After Visit Summary for other counseling recommendations.      [name], MD, FACOG Obstetrician & Gynecologist, [facility] for [employer], [facility]

## 2021-03-14 LAB — RPR+HBSAG+HCVAB+...
HIV Screen 4th Generation wRfx: NONREACTIVE
Hep C Virus Ab: <0.1 s/co ratio (ref 0.0–0.9)
Hepatitis B Surface Ag: NEGATIVE
RPR Ser Ql: NONREACTIVE

## 2021-03-15 ENCOUNTER — Ambulatory Visit
Admission: RE | Admit: 2021-03-15 | Discharge: 2021-03-15 | Disposition: A | Discharge status: Home / Self Care | Payer: Medicaid Other | Source: Ambulatory Visit | Attending: Obstetrics & Gynecology | Admitting: Obstetrics & Gynecology

## 2021-03-15 ENCOUNTER — Ambulatory Visit: Payer: Medicaid Other

## 2021-03-15 ENCOUNTER — Other Ambulatory Visit: Payer: Self-pay

## 2021-03-15 DIAGNOSIS — N644 Mastodynia: Secondary | ICD-10-CM

## 2021-03-15 DIAGNOSIS — Z803 Family history of malignant neoplasm of breast: Secondary | ICD-10-CM

## 2021-03-15 DIAGNOSIS — N63 Unspecified lump in unspecified breast: Secondary | ICD-10-CM

## 2021-03-16 LAB — CYTOLOGY - PAP
Chlamydia: NEGATIVE
Comment: NEGATIVE
Comment: NEGATIVE
Comment: NEGATIVE
Comment: NORMAL
Diagnosis: NEGATIVE
High risk HPV: NEGATIVE
Neisseria Gonorrhea: NEGATIVE
Trichomonas: NEGATIVE

## 2021-10-16 ENCOUNTER — Ambulatory Visit (INDEPENDENT_AMBULATORY_CARE_PROVIDER_SITE_OTHER): Payer: Medicaid Other | Admitting: Obstetrics and Gynecology

## 2021-10-16 ENCOUNTER — Other Ambulatory Visit (HOSPITAL_COMMUNITY)
Admission: RE | Admit: 2021-10-16 | Discharge: 2021-10-16 | Disposition: A | Discharge status: Home / Self Care | Payer: Medicaid Other | Source: Ambulatory Visit | Attending: Obstetrics and Gynecology | Admitting: Obstetrics and Gynecology

## 2021-10-16 VITALS — BP 104/71 | HR 88 | Wt 112.0 lb

## 2021-10-16 DIAGNOSIS — N760 Acute vaginitis: Secondary | ICD-10-CM | POA: Insufficient documentation

## 2021-10-16 DIAGNOSIS — N912 Amenorrhea, unspecified: Secondary | ICD-10-CM

## 2021-10-16 DIAGNOSIS — Z113 Encounter for screening for infections with a predominantly sexual mode of transmission: Secondary | ICD-10-CM | POA: Diagnosis not present

## 2021-10-16 LAB — POCT URINE PREGNANCY: Preg Test, Ur: NEGATIVE

## 2021-10-16 NOTE — Progress Notes (Unsigned)
SUBJECTIVE:  31 y.o. female who desires a STI screen. Denies abnormal vaginal discharge, bleeding or significant pelvic pain. No UTI symptoms. Denies history of known exposure to STD.  No LMP recorded.  OBJECTIVE:  She appears well.   ASSESSMENT:  STI Screen   PLAN:  Pt offered STI blood screening-requested GC, chlamydia, and trichomonas probe sent to lab.  Treatment: To be determined once lab results are received.  Pt follow up as needed.

## 2021-10-17 LAB — CERVICOVAGINAL ANCILLARY ONLY
Bacterial Vaginitis (gardnerella): POSITIVE — AB
Candida Glabrata: NEGATIVE
Candida Vaginitis: NEGATIVE
Chlamydia: NEGATIVE
Comment: NEGATIVE
Comment: NEGATIVE
Comment: NEGATIVE
Comment: NEGATIVE
Comment: NEGATIVE
Comment: NORMAL
Neisseria Gonorrhea: NEGATIVE
Trichomonas: NEGATIVE

## 2021-10-17 LAB — HEPATITIS C ANTIBODY: Hep C Virus Ab: NONREACTIVE

## 2021-10-17 LAB — HEPATITIS B SURFACE ANTIGEN: Hepatitis B Surface Ag: NEGATIVE

## 2021-10-17 LAB — RPR: RPR Ser Ql: NONREACTIVE

## 2021-10-17 LAB — HIV ANTIBODY (ROUTINE TESTING W REFLEX): HIV Screen 4th Generation wRfx: NONREACTIVE

## 2021-10-17 MED ORDER — METRONIDAZOLE 500 MG PO TABS: 500.0000 mg | ORAL_TABLET | Freq: Two times a day (BID) | ORAL | 0 refills | Status: DC

## 2021-10-19 ENCOUNTER — Telehealth: Payer: Self-pay | Admitting: Family Medicine

## 2021-10-19 NOTE — Telephone Encounter (Signed)
Patient came into office concern about her irregular cycles and would like to schedule an appt with a provider asap.

## 2021-11-14 ENCOUNTER — Ambulatory Visit: Payer: Medicaid Other | Admitting: Family Medicine

## 2022-01-01 ENCOUNTER — Ambulatory Visit: Payer: Medicaid Other | Admitting: Nurse Practitioner

## 2022-01-01 ENCOUNTER — Encounter: Payer: Self-pay | Admitting: Nurse Practitioner

## 2022-01-01 VITALS — BP 112/78 | HR 96 | Temp 97.7°F | Resp 10 | Ht 62.0 in | Wt 111.1 lb

## 2022-01-01 DIAGNOSIS — Z111 Encounter for screening for respiratory tuberculosis: Secondary | ICD-10-CM | POA: Diagnosis not present

## 2022-01-01 DIAGNOSIS — Z0000 Encounter for general adult medical examination without abnormal findings: Secondary | ICD-10-CM | POA: Insufficient documentation

## 2022-01-01 DIAGNOSIS — Z13220 Encounter for screening for lipoid disorders: Secondary | ICD-10-CM

## 2022-01-01 LAB — LIPID PANEL
Cholesterol: 182 mg/dL (ref 0–200)
HDL: 67.4 mg/dL (ref >39.00)
LDL Cholesterol: 96 mg/dL (ref 0–99)
NonHDL: 114.96
Total CHOL/HDL Ratio: 3
Triglycerides: 97 mg/dL (ref 0.0–149.0)
VLDL: 19.4 mg/dL (ref 0.0–40.0)

## 2022-01-01 LAB — CBC
HCT: 40.6 % (ref 36.0–46.0)
Hemoglobin: 13 g/dL (ref 12.0–15.0)
MCHC: 32 g/dL (ref 30.0–36.0)
MCV: 82.5 fl (ref 78.0–100.0)
Platelets: 261 10*3/uL (ref 150.0–400.0)
RBC: 4.93 Mil/uL (ref 3.87–5.11)
RDW: 14.7 % (ref 11.5–15.5)
WBC: 5.3 10*3/uL (ref 4.0–10.5)

## 2022-01-01 LAB — COMPREHENSIVE METABOLIC PANEL
ALT: 12 U/L (ref 0–35)
AST: 12 U/L (ref 0–37)
Albumin: 4.5 g/dL (ref 3.5–5.2)
Alkaline Phosphatase: 43 U/L (ref 39–117)
BUN: 8 mg/dL (ref 6–23)
CO2: 29 mEq/L (ref 19–32)
Calcium: 9.6 mg/dL (ref 8.4–10.5)
Chloride: 101 mEq/L (ref 96–112)
Creatinine, Ser: 0.79 mg/dL (ref 0.40–1.20)
GFR: 98.14 mL/min (ref >60.00)
Glucose, Bld: 79 mg/dL (ref 70–99)
Potassium: 4.5 mEq/L (ref 3.5–5.1)
Sodium: 138 mEq/L (ref 135–145)
Total Bilirubin: 0.5 mg/dL (ref 0.2–1.2)
Total Protein: 7.3 g/dL (ref 6.0–8.3)

## 2022-01-01 LAB — TSH: TSH: 0.43 u[IU]/mL (ref 0.35–5.50)

## 2022-01-01 NOTE — Progress Notes (Signed)
New Patient Office Visit  Subjective    Patient ID: Jeanne Stark, female    DOB: [birth year]  Age: 31 y.o. MRN: [id number]  CC:  Chief Complaint  Patient presents with   Establish Care    No previous PCP-[facility]   Annual Exam    Will need a form for work and Tb test    HPI [place] presents to establish care  for complete physical and follow up of chronic conditions.  Immunizations: -Tetanus: unsure -Influenza: refused -Covid-19: refused -Shingles: Too young -Pneumonia: Too young  -HPV: Followed by GYN  Diet: Fair diet. Eats approx small breakfast, lunch larger meal, snack or two, and dinner. Water and herbal tea Exercise: No regular exercise. Dance and works a lot  Eye exam: PRN Dental exam: Completes semi-annually   Pap Smear: Completed in 2022, normal with negative high risk hpv Mammogram: Completed in 03/2021  Colonoscopy: Too young, currently average risk Lung Cancer Screening: N/A Dexa: Too young   Sleep: states goes to bed 10-130 and gets up 5 am. Does feel rested. Doe snot snore     Outpatient Encounter Medications as of 01/01/2022  Medication Sig   Boric Acid CRYS Place 600 mg vaginally at bedtime. Use vaginally every night for two weeks then twice a week   [DISCONTINUED] metroNIDAZOLE (FLAGYL) 500 MG tablet Take 1 tablet (500 mg total) by mouth 2 (two) times daily.   No facility-administered encounter medications on file as of 01/01/2022.    Past Medical History:  Diagnosis Date   Gall stones    Herpes    Hx of carpal tunnel syndrome    Preterm labor    Trichomonal vaginitis     Past Surgical History:  Procedure Laterality Date   CESAREAN SECTION N/A 07/24/2013   Procedure: Emergent CESAREAN SECTION, repair of vaginal laceration;  Surgeon: Geryl Rankins, MD;  Location: [facility];  Service: Obstetrics;  Laterality: N/A;   LAPAROSCOPIC TUBAL LIGATION Bilateral 09/20/2013   Procedure: LAPAROSCOPIC TUBAL  LIGATION;  Surgeon: Kirkland Hun, MD;  Location: [facility];  Service: Gynecology;  Laterality: Bilateral;   TUBAL LIGATION     WISDOM TOOTH EXTRACTION      Family History  Problem Relation Age of Onset   Cancer Mother    Hyperlipidemia Mother    Breast cancer Mother 2   Cancer Maternal Aunt        breast   COPD Maternal Aunt        breast   Breast cancer Maternal Aunt    Cancer Maternal Grandmother        breast   COPD Maternal Grandmother        breast   Breast cancer Maternal Grandmother 48    Social History   Socioeconomic History   Marital status: Single    Spouse name: Not on file   Number of children: 3   Years of education: Some   Highest education level: Not on file  Occupational History   Not on file  Tobacco Use   Smoking status: Never   Smokeless tobacco: Never  Vaping Use   Vaping Use: Never used  Substance and Sexual Activity   Alcohol use: Not Currently    Comment: occasional   Drug use: No   Sexual activity: Yes    Birth control/protection: None, Surgical, Condom  Other Topics Concern   Not on file  Social History Narrative   Works at [employer]  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      13 [address], 8 [place], [place] Strain: Not on [place]: Not on file  Transportation Needs: Not on file  Physical Activity: Sufficiently Active (03/20/2017)   Exercise Vital Sign    Days of Exercise per Week: 5 days    Minutes of Exercise per Session: 60 min  Stress: No Stress Concern Present (03/20/2017)   [place] of Stress : Not at all  Social Connections: Somewhat Isolated (03/20/2017)   Social Connection and Isolation Panel [NHANES]    Frequency of Communication with Friends and Family: More than three times a week    Frequency of Social Gatherings with Friends and  Family: Twice a week    Attends Religious Services: Never    Marine scientist or Organizations: Yes    Attends Archivist Meetings: Never    Marital Status: Never married  Intimate Partner Violence: Not At Risk (03/20/2017)   Humiliation, Afraid, Rape, and Kick questionnaire    Fear of Current or Ex-Partner: No    Emotionally Abused: No    Physically Abused: No    Sexually Abused: No    Review of Systems  Constitutional:  Negative for chills, fever and malaise/fatigue.  Respiratory:  Negative for shortness of breath.   Cardiovascular:  Negative for chest pain (every few months, thinks) and leg swelling.  Gastrointestinal:  Negative for abdominal pain, blood in stool, constipation, diarrhea, nausea and vomiting.       Bm daily  Genitourinary:  Negative for dysuria and hematuria.  Neurological:  Negative for headaches.  Psychiatric/Behavioral:  Negative for hallucinations and suicidal ideas.         Objective    BP 112/78   Pulse 96   Temp 97.7 F (36.5 C)   Resp 10   Ht 5\' 2"  (1.575 m)   Wt 111 lb 2 oz (50.4 kg)   LMP 12/14/2021   SpO2 99%   BMI 20.33 kg/m   Physical Exam Vitals and nursing note reviewed.  Constitutional:      Appearance: Normal appearance.  HENT:     Right Ear: Tympanic membrane, ear canal and external ear normal.     Left Ear: Tympanic membrane, ear canal and external ear normal.     Mouth/Throat:     Mouth: Mucous membranes are moist.     Pharynx: Oropharynx is clear.  Eyes:     Extraocular Movements: Extraocular movements intact.     Pupils: Pupils are equal, round, and reactive to light.  Cardiovascular:     Rate and Rhythm: Normal rate and regular rhythm.     Pulses: Normal pulses.     Heart sounds: Normal heart sounds.  Pulmonary:     Effort: Pulmonary effort is normal.     Breath sounds: Normal breath sounds.  Abdominal:     General: Bowel sounds are normal. There is no distension.     Palpations: There is no mass.      Tenderness: There is no abdominal tenderness.     Hernia: No hernia is present.  Musculoskeletal:     Right lower leg: No edema.     Left lower leg: No edema.  Lymphadenopathy:     Cervical: No cervical adenopathy.  Skin:    General: Skin  is warm.  Neurological:     General: No focal deficit present.     Mental Status: She is alert.     Deep Tendon Reflexes:     Reflex Scores:      Bicep reflexes are 2+ on the right side and 2+ on the left side.      Patellar reflexes are 2+ on the right side and 2+ on the left side.    Comments: Bilateral upper and lower extremity strength 5/5  Psychiatric:        Mood and Affect: Mood normal.        Behavior: Behavior normal.        Thought Content: Thought content normal.        Judgment: Judgment normal.         Assessment & Plan:   Problem List Items Addressed This Visit       Other   Screening for tuberculosis    No history of TB No history of BCG vaccine No history of positive PPD      Relevant Orders   PPD (Completed)   Preventative health care - Primary    Discussed age-appropriate immunizations and screening exams.      Relevant Orders   CBC   Comprehensive metabolic panel   Lipid panel   TSH   Other Visit Diagnoses     Screening for lipid disorders       Relevant Orders   Lipid panel       Return in about 1 year (around 01/02/2023) for CPE and labs.   Audria Nine, NP

## 2022-01-01 NOTE — Patient Instructions (Signed)
Nice to see you today I will be in touch with the labs once I have them Come back in 2 days for [place] to read/result the TB test Follow up in 1 year for your next physical, sooner if you need me

## 2022-01-01 NOTE — Assessment & Plan Note (Signed)
No history of TB No history of BCG vaccine No history of positive PPD

## 2022-01-01 NOTE — Assessment & Plan Note (Signed)
Discussed age-appropriate immunizations and screening exams. 

## 2022-01-03 ENCOUNTER — Telehealth: Payer: Self-pay

## 2022-01-03 LAB — TB SKIN TEST
Induration: 0 mm
TB Skin Test: NEGATIVE

## 2022-01-03 NOTE — Telephone Encounter (Signed)
Left detailed message letting patient know form is ready up front for pick up.

## 2022-01-03 NOTE — Telephone Encounter (Signed)
Form was completed and signed. Placed on your desk.

## 2022-01-03 NOTE — Telephone Encounter (Signed)
Patient came by this morning to have TB test read and dropped off form that she needed filled out for work. Would like to pick this up as soon as possible. Placed in Matt's inbox for review.

## 2022-06-21 ENCOUNTER — Encounter: Payer: Self-pay | Admitting: Nurse Practitioner

## 2022-06-21 ENCOUNTER — Ambulatory Visit: Payer: Medicaid Other | Admitting: Nurse Practitioner

## 2022-06-21 ENCOUNTER — Ambulatory Visit (INDEPENDENT_AMBULATORY_CARE_PROVIDER_SITE_OTHER)
Admission: RE | Admit: 2022-06-21 | Discharge: 2022-06-21 | Disposition: A | Discharge status: Home / Self Care | Payer: Medicaid Other | Source: Ambulatory Visit | Attending: Nurse Practitioner | Admitting: Nurse Practitioner

## 2022-06-21 VITALS — BP 118/70 | HR 86 | Temp 97.4°F | Ht 62.0 in | Wt 114.2 lb

## 2022-06-21 DIAGNOSIS — L301 Dyshidrosis [pompholyx]: Secondary | ICD-10-CM

## 2022-06-21 DIAGNOSIS — M79641 Pain in right hand: Secondary | ICD-10-CM | POA: Insufficient documentation

## 2022-06-21 MED ORDER — TRIAMCINOLONE ACETONIDE 0.5 % EX CREA: 1.0000 | TOPICAL_CREAM | Freq: Three times a day (TID) | CUTANEOUS | 0 refills | Status: DC

## 2022-06-21 NOTE — Assessment & Plan Note (Signed)
Ambiguous in nature.  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  She can try an over-the-counter NSAID prior to going to work and see if this is beneficial.  Pending x-ray today

## 2022-06-21 NOTE — Progress Notes (Signed)
Acute Office Visit  Subjective:     Patient ID: Jeanne Stark, female    DOB: 1990-09-15, 31 y.o.   MRN: [id number]  Chief Complaint  Patient presents with   Hand Pain    Right hand fingers swell, get itchy and fell tight Right palm does not want to grip sometimes x 1-2 months    HPI Patient is in today for  hand pain with a noncontributory history  Symptoms started over the summer with the fingers. States with the hand it has been 2 monhts. States no injury. Statse that her fingers are swelling, tenderness, reddness and itching. States that over the pat 2 months in the palm she has to strain to grab and feels like popping int he hand. SHe is right hand dominnat.  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  Musculoskeletal:  Positive for joint pain.  Skin:  Positive for itching and rash.  Neurological:  Positive for tingling.        Objective:    BP 118/70   Pulse 86   Temp (!) 97.4 F (36.3 C) (Oral)   Ht 5' 2"$  (1.575 m)   Wt 114 lb 3.2 oz (51.8 kg)   LMP 06/15/2022   SpO2 99%   BMI 20.89 kg/m    Physical Exam Vitals and nursing note reviewed.  Constitutional:      Appearance: Normal appearance.  Cardiovascular:     Rate and Rhythm: Normal rate and regular rhythm.     Heart sounds: Normal heart sounds.  Pulmonary:     Effort: Pulmonary effort is normal.     Breath sounds: Normal breath sounds.  Musculoskeletal:     Comments: Bilateral hand and finger strength 5/5 2+ radial pulses bilaterally 2+ bicep tendon reflexes. Negative Finkelstein test  Skin:    General: Skin is warm.     Comments: Rash occurs on first second and third digits no appreciable today in office.  Neurological:     Mental Status: She is alert.      No results found for any visits on 06/21/22.      Assessment & Plan:   Problem List Items Addressed This Visit       Musculoskeletal and Integument   Dyshidrotic eczema    No appreciable rash in office today.  Does sound clinically consistent with dyshidrotic eczema.  Triamcinolone 0.5% cream twice daily as needed no more than 7 days consecutive use.  Patient was given precautions in regards to skin thinning and lightening of skin pigmentation with overuse of medication.      Relevant Medications   triamcinolone cream (KENALOG) 0.5 %     Other   Right hand pain - Primary    Ambiguous in nature.  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  She can try an over-the-counter NSAID prior to going to work and see if this is beneficial.  Pending x-ray today      Relevant Orders   DG Hand Complete Right    Meds ordered this encounter  Medications   triamcinolone cream (KENALOG) 0.5 %    Sig: Apply 1 Application topically 3 (three) times daily.  Dispense:  30 g    Refill:  0    Order Specific Question:   Supervising Provider    Answer:   Loura Pardon A [[id number]]    Return in about 7 months (around 01/20/2023), or if symptoms worsen or fail to improve, for CPE and Labs.  Romilda Garret, NP

## 2022-06-21 NOTE — Assessment & Plan Note (Signed)
No appreciable rash in office today.  Does sound clinically consistent with dyshidrotic eczema.  Triamcinolone 0.5% cream twice daily as needed no more than 7 days consecutive use.  Patient was given precautions in regards to skin thinning and lightening of skin pigmentation with overuse of medication.

## 2022-06-21 NOTE — Patient Instructions (Signed)
Nice to see you today I will send in a cream that you can use AS NEEDED when the lesion/rash happens. You can use it twice a day. No more than 7 days use in a row I will be in touch with the hand xray once I have the results If you do not improve let me know and we can get you in with Dr Lorelei Pont our supports medidicne doctor

## 2022-06-25 ENCOUNTER — Telehealth: Payer: Self-pay | Admitting: Nurse Practitioner

## 2022-06-25 DIAGNOSIS — M79641 Pain in right hand: Secondary | ICD-10-CM

## 2022-06-25 NOTE — Telephone Encounter (Signed)
Pt called stating she saw Cable on 06/21/22 for hand issues. Pt is requesting a referral to a specialist for her hand? Call back # [phone]

## 2022-06-25 NOTE — Telephone Encounter (Signed)
Can we let her know I just got the results from the xray and they were normal. Does she want  or [place]

## 2022-06-25 NOTE — Telephone Encounter (Signed)
Called pt and she said that she wanted the referral in [place].

## 2022-06-26 ENCOUNTER — Encounter: Payer: Self-pay | Admitting: *Deleted

## 2022-06-26 NOTE — Addendum Note (Signed)
Addended by: Michela Pitcher on: 06/26/2022 07:39 AM   Modules accepted: Orders

## 2022-06-26 NOTE — Telephone Encounter (Signed)
Referral placed.

## 2022-07-09 ENCOUNTER — Ambulatory Visit (INDEPENDENT_AMBULATORY_CARE_PROVIDER_SITE_OTHER): Payer: Medicaid Other | Admitting: Orthopaedic Surgery

## 2022-07-09 ENCOUNTER — Encounter: Payer: Self-pay | Admitting: Orthopaedic Surgery

## 2022-07-09 DIAGNOSIS — M79641 Pain in right hand: Secondary | ICD-10-CM | POA: Diagnosis not present

## 2022-07-09 MED ORDER — DICLOFENAC SODIUM 75 MG PO TBEC: DELAYED_RELEASE_TABLET | ORAL | 2 refills | Status: DC

## 2022-07-09 NOTE — Progress Notes (Unsigned)
Office Visit Note   Patient: Jeanne Stark           Date of Birth: Jan 22, 1991           MRN: [id number] Visit Date: 07/09/2022              Requested by: Michela Pitcher, NP [address] [place],  [place] [address] PCP: Michela Pitcher, NP   Assessment & Plan: Visit Diagnoses:  1. Pain in right hand     Plan: Impression is intermittent right hand index and long finger pain and swelling.  Is hard to tell the actual etiology but we would like to try her on a course of anti-inflammatories for the next few weeks.  These been sent in.  She will follow-up with [place] as needed.  Follow-Up Instructions: Return if symptoms worsen or fail to improve.   Orders:  No orders of the defined types were placed in this encounter.  Meds ordered this encounter  Medications   diclofenac (VOLTAREN) 75 MG EC tablet    Sig: Take one pill po bid x 2 weeks and then one pill po bid prn pain    Dispense:  60 tablet    Refill:  2      Procedures: No procedures performed   Clinical Data: No additional findings.   Subjective: Chief Complaint  Patient presents with   Right Hand - Pain    HPI patient is a very pleasant 32 year old right-hand-dominant female who comes in today with intermittent pain and swelling to the right hand index and long fingers.  When she has the symptoms she also notes redness and itching to the fingers.  She has not tried any anti-inflammatories.  She denies any paresthesias.  No personal or family history of autoimmune disease.  She has been seen by her PCP who diagnosed her with eczema.  Review of Systems as detailed in HPI.  All others reviewed and are negative.   Objective: Vital Signs: LMP 06/15/2022   Physical Exam well-developed well-nourished female no acute distress.  Alert and oriented x 3.  Ortho Exam right hand exam shows mild swelling to the index finger compared to the contralateral side.  Full range of motion.  No skin changes.  No tenderness.  She  is neurovascular intact distally.  Specialty Comments:  No specialty comments available.  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--patient reports previous hx, no documentation in [place] 06/21/2013   Past Medical History:  Diagnosis Date   Gall stones    Herpes    Hx of carpal tunnel syndrome    Preterm labor    Trichomonal vaginitis     Family History  Problem Relation Age of Onset   Cancer Mother    Hyperlipidemia Mother    Breast cancer Mother 40   Cancer Maternal Aunt        breast   COPD Maternal Aunt        breast   Breast cancer Maternal Aunt  Cancer Maternal Grandmother        breast   COPD Maternal Grandmother        breast   Breast cancer Maternal Grandmother 104    Past Surgical History:  Procedure Laterality Date   CESAREAN SECTION N/A 07/24/2013   Procedure: Emergent CESAREAN SECTION, repair of vaginal laceration;  Surgeon: Thurnell Lose, MD;  Location: [facility];  Service: Obstetrics;  Laterality: N/A;   LAPAROSCOPIC TUBAL LIGATION Bilateral 09/20/2013   Procedure: LAPAROSCOPIC TUBAL LIGATION;  Surgeon: Ena Dawley, MD;  Location: [facility];  Service: Gynecology;  Laterality: Bilateral;   TUBAL LIGATION     WISDOM TOOTH EXTRACTION     Social History   Occupational History   Not on file  Tobacco Use   Smoking status: Never   Smokeless tobacco: Never  Vaping Use   Vaping Use: Never used  Substance and Sexual Activity   Alcohol use: Not Currently     Comment: occasional   Drug use: No   Sexual activity: Yes    Birth control/protection: None, Surgical, Condom

## 2022-11-04 ENCOUNTER — Ambulatory Visit: Payer: Medicaid Other

## 2022-11-06 ENCOUNTER — Other Ambulatory Visit (HOSPITAL_COMMUNITY)
Admission: RE | Admit: 2022-11-06 | Discharge: 2022-11-06 | Disposition: A | Discharge status: Home / Self Care | Payer: Medicaid Other | Source: Ambulatory Visit | Attending: Obstetrics and Gynecology | Admitting: Obstetrics and Gynecology

## 2022-11-06 ENCOUNTER — Ambulatory Visit (INDEPENDENT_AMBULATORY_CARE_PROVIDER_SITE_OTHER): Payer: Medicaid Other

## 2022-11-06 VITALS — BP 101/64 | HR 75

## 2022-11-06 DIAGNOSIS — Z113 Encounter for screening for infections with a predominantly sexual mode of transmission: Secondary | ICD-10-CM | POA: Diagnosis not present

## 2022-11-06 NOTE — Progress Notes (Signed)
SUBJECTIVE:  32 y.o. female who desires a STI screen. Denies abnormal vaginal discharge, bleeding or significant pelvic pain. No UTI symptoms. Denies history of known exposure to STD.  No LMP recorded.  OBJECTIVE:  She appears well.   ASSESSMENT:  STI Screen   PLAN:  Pt offered STI blood screening-requested GC, chlamydia, and trichomonas probe sent to lab. Pt declines BV and Yeast Screening. Treatment: To be determined once lab results are received.  Pt follow up as needed.

## 2022-11-07 LAB — RPR+HBSAG+HCVAB+...
HIV Screen 4th Generation wRfx: NONREACTIVE
Hep C Virus Ab: NONREACTIVE
Hepatitis B Surface Ag: NEGATIVE
RPR Ser Ql: NONREACTIVE

## 2022-11-08 LAB — CERVICOVAGINAL ANCILLARY ONLY
Chlamydia: NEGATIVE
Comment: NEGATIVE
Comment: NEGATIVE
Comment: NORMAL
Neisseria Gonorrhea: NEGATIVE
Trichomonas: NEGATIVE

## 2023-02-28 ENCOUNTER — Ambulatory Visit (INDEPENDENT_AMBULATORY_CARE_PROVIDER_SITE_OTHER): Payer: Medicaid Other | Admitting: Nurse Practitioner

## 2023-02-28 ENCOUNTER — Encounter: Payer: Self-pay | Admitting: Nurse Practitioner

## 2023-02-28 VITALS — BP 120/80 | HR 80 | Temp 98.2°F | Ht 62.0 in | Wt 118.4 lb

## 2023-02-28 DIAGNOSIS — Z1231 Encounter for screening mammogram for malignant neoplasm of breast: Secondary | ICD-10-CM

## 2023-02-28 DIAGNOSIS — Z0000 Encounter for general adult medical examination without abnormal findings: Secondary | ICD-10-CM

## 2023-02-28 LAB — COMPREHENSIVE METABOLIC PANEL
ALT: 14 U/L (ref 0–35)
AST: 13 U/L (ref 0–37)
Albumin: 4.8 g/dL (ref 3.5–5.2)
Alkaline Phosphatase: 44 U/L (ref 39–117)
BUN: 15 mg/dL (ref 6–23)
CO2: 27 meq/L (ref 19–32)
Calcium: 9.7 mg/dL (ref 8.4–10.5)
Chloride: 100 meq/L (ref 96–112)
Creatinine, Ser: 0.95 mg/dL (ref 0.40–1.20)
GFR: 78.02 mL/min (ref >60.00)
Glucose, Bld: 86 mg/dL (ref 70–99)
Potassium: 4.2 meq/L (ref 3.5–5.1)
Sodium: 135 meq/L (ref 135–145)
Total Bilirubin: 0.4 mg/dL (ref 0.2–1.2)
Total Protein: 7.7 g/dL (ref 6.0–8.3)

## 2023-02-28 LAB — CBC
HCT: 42.4 % (ref 36.0–46.0)
Hemoglobin: 13.2 g/dL (ref 12.0–15.0)
MCHC: 31.1 g/dL (ref 30.0–36.0)
MCV: 82.7 fL (ref 78.0–100.0)
Platelets: 295 10*3/uL (ref 150.0–400.0)
RBC: 5.13 Mil/uL — ABNORMAL HIGH (ref 3.87–5.11)
RDW: 14.5 % (ref 11.5–15.5)
WBC: 5.2 10*3/uL (ref 4.0–10.5)

## 2023-02-28 LAB — TSH: TSH: 0.77 u[IU]/mL (ref 0.35–5.50)

## 2023-02-28 NOTE — Assessment & Plan Note (Signed)
Discussed age-appropriate immunizations and screening exams.  Did review patient's personal, surgical, social, family histories.  Patient is up-to-date on all age-appropriate vaccinations she would like.  Patient refused Tdap and flu vaccine today.  Patient is too young for CRC screening.  She is up-to-date on cervical cancer screening.  Mammogram placed today for breast cancer screening.  Patient was given information at discharge to call and get mammogram set up.  Patient was also given information at discharge about preventative healthcare maintenance with anticipatory guidance

## 2023-02-28 NOTE — Patient Instructions (Signed)
Nice to see you today I will be in touch with the labs once I have reviewed them Make a nurse visit for next week to get the TB test placed

## 2023-02-28 NOTE — Progress Notes (Signed)
Established Patient Office Visit  Subjective   Patient ID: RAEDYN LANZAS, female    DOB: Nov 05, 1990  Age: 32 y.o. MRN: [id number]  Chief Complaint  Patient presents with   Annual Exam    HPI  for complete physical and follow up of chronic conditions.  Immunizations: -Tetanus: need to updated, patient deferred -Influenza: refused -Shingles: Too young -Pneumonia: Too young -covid: refused   Diet: Fair diet. States she is eating 2-3 meals a day. States that she does snack. She will drink water and sometimes coffee (daily) Exercise: No regular exercise. Regular day to day stuff   Eye exam: prn Dental exam: Completes semi-annually    Colonoscopy: Too young to currently average risk Lung Cancer Screening: N/A  Pap smear: UTD patient is followed by GYN last 1 done in 2022 negative for high risk HPV and normal cells  Mammogram: Order placed for [facility] with family history.  Sleep: 9-10 and get up at 5 am and feels rested       Review of Systems  Constitutional:  Negative for chills and fever.  Respiratory:  Negative for shortness of breath.   Cardiovascular:  Negative for chest pain and leg swelling.  Gastrointestinal:  Negative for abdominal pain, blood in stool, constipation, diarrhea, nausea and vomiting.  Genitourinary:  Negative for dysuria and hematuria.  Neurological:  Negative for tingling and headaches.  Psychiatric/Behavioral:  Negative for hallucinations and suicidal ideas.       Objective:     BP 120/80   Pulse 80   Temp 98.2 F (36.8 C) (Oral)   Ht 5\' 2"  (1.575 m)   Wt 118 lb 6.4 oz (53.7 kg)   LMP 02/11/2023 (Exact Date)   SpO2 99%   BMI 21.66 kg/m    Physical Exam Vitals and nursing note reviewed.  Constitutional:      Appearance: Normal appearance.  HENT:     Right Ear: Tympanic membrane, ear canal and external ear normal.     Left Ear: Tympanic membrane, ear canal and external ear normal.     Mouth/Throat:      Mouth: Mucous membranes are moist.     Pharynx: Oropharynx is clear.  Eyes:     Extraocular Movements: Extraocular movements intact.     Pupils: Pupils are equal, round, and reactive to light.  Cardiovascular:     Rate and Rhythm: Normal rate and regular rhythm.     Pulses: Normal pulses.     Heart sounds: Normal heart sounds.  Pulmonary:     Effort: Pulmonary effort is normal.     Breath sounds: Normal breath sounds.  Abdominal:     General: Bowel sounds are normal. There is no distension.     Palpations: There is no mass.     Tenderness: There is no abdominal tenderness.     Hernia: No hernia is present.  Musculoskeletal:     Right lower leg: No edema.     Left lower leg: No edema.  Lymphadenopathy:     Cervical: No cervical adenopathy.  Skin:    General: Skin is warm.  Neurological:     General: No focal deficit present.     Mental Status: She is alert.     Deep Tendon Reflexes:     Reflex Scores:      Bicep reflexes are 2+ on the right side and 2+ on the left side.      Patellar reflexes are 2+ on the right side and  2+ on the left side.    Comments: Bilateral upper and lower extremity strength 5/5  Psychiatric:        Mood and Affect: Mood normal.        Behavior: Behavior normal.        Thought Content: Thought content normal.        Judgment: Judgment normal.      No results found for any visits on 02/28/23.    The ASCVD Risk score (Arnett DK, et al., 2019) failed to calculate for the following reasons:   The 2019 ASCVD risk score is only valid for ages 76 to 30    Assessment & Plan:   Problem List Items Addressed This Visit       Other   Preventative health care - Primary    Discussed age-appropriate immunizations and screening exams.  Did review patient's personal, surgical, social, family histories.  Patient is up-to-date on all age-appropriate vaccinations she would like.  Patient refused Tdap and flu vaccine today.  Patient is too young for CRC  screening.  She is up-to-date on cervical cancer screening.  Mammogram placed today for breast cancer screening.  Patient was given information at discharge to call and get mammogram set up.  Patient was also given information at discharge about preventative healthcare maintenance with anticipatory guidance      Relevant Orders   CBC   Comprehensive metabolic panel   TSH   Other Visit Diagnoses     Screening mammogram for breast cancer       Relevant Orders   MM 3D SCREENING MAMMOGRAM BILATERAL BREAST       Return in about 1 year (around 02/28/2024) for CPE and Labs.    Audria Nine, NP

## 2023-03-03 ENCOUNTER — Ambulatory Visit (INDEPENDENT_AMBULATORY_CARE_PROVIDER_SITE_OTHER): Payer: Medicaid Other

## 2023-03-03 DIAGNOSIS — Z111 Encounter for screening for respiratory tuberculosis: Secondary | ICD-10-CM | POA: Diagnosis not present

## 2023-03-03 NOTE — Progress Notes (Signed)
PPD Placement note [place] 32 y.o. female is here today for placement of PPD test Reason for PPD test: Needed for work Pt taken PPD test before: yes Verified in allergy area and with patient that they are not allergic to the products PPD is made of (Phenol or Tween). Yes Is patient taking any oral or IV steroid medication now or have they taken it in the last month? no Has the patient ever received the BCG vaccine?: no Has the patient been in recent contact with anyone known or suspected of having active TB disease?: no      Date of exposure (if applicable): N/A      Name of person they were exposed to (if applicable): N/A Patient's Country of origin?: [place] States O: Alert and oriented in NAD. P:  PPD placed on 03/03/2023.  Patient advised to return for reading within 48-72 hours.

## 2023-03-05 ENCOUNTER — Ambulatory Visit: Payer: Medicaid Other

## 2023-03-05 DIAGNOSIS — Z111 Encounter for screening for respiratory tuberculosis: Secondary | ICD-10-CM | POA: Diagnosis not present

## 2023-03-05 LAB — TB SKIN TEST
Induration: 0 mm
TB Skin Test: NEGATIVE

## 2023-03-05 NOTE — Progress Notes (Signed)
 PPD Reading Note  PPD read and results entered in EpicCare.  Result: 0 mm induration.  Interpretation: Neg  If test not read within 48-72 hours of initial placement, patient advised to repeat in other arm 1-3 weeks after this test.  Allergic reaction: no

## 2023-03-19 ENCOUNTER — Encounter: Payer: Self-pay | Admitting: Nurse Practitioner

## 2023-03-27 ENCOUNTER — Telehealth: Payer: Self-pay | Admitting: Nurse Practitioner

## 2023-03-27 DIAGNOSIS — L301 Dyshidrosis [pompholyx]: Secondary | ICD-10-CM

## 2023-03-27 MED ORDER — TRIAMCINOLONE ACETONIDE 0.5 % EX CREA: 1.0000 | TOPICAL_CREAM | Freq: Two times a day (BID) | CUTANEOUS | 0 refills | Status: DC

## 2023-03-27 NOTE — Telephone Encounter (Signed)
Prescription Request  03/27/2023  LOV: 02/28/2023  What is the name of the medication or equipment? triamcinolone cream (KENALOG) 0.5 %   Have you contacted your pharmacy to request a refill? Yes   Which pharmacy would you like this sent to?  [id number] - [place] ([place]), [place] - [address]  ([place]) [place] [address] Phone: [phone] Fax: [phone]    Patient notified that their request is being sent to the clinical staff for review and that they should receive a response within 2 business days.   Please advise at Mobile [phone] (mobile)

## 2023-03-28 ENCOUNTER — Ambulatory Visit
Admission: RE | Admit: 2023-03-28 | Discharge: 2023-03-28 | Disposition: A | Discharge status: Home / Self Care | Payer: Medicaid Other | Source: Ambulatory Visit | Attending: Nurse Practitioner

## 2023-03-28 DIAGNOSIS — Z1231 Encounter for screening mammogram for malignant neoplasm of breast: Secondary | ICD-10-CM

## 2023-03-31 ENCOUNTER — Ambulatory Visit: Payer: Medicaid Other

## 2023-04-11 ENCOUNTER — Ambulatory Visit (INDEPENDENT_AMBULATORY_CARE_PROVIDER_SITE_OTHER): Payer: Medicaid Other

## 2023-04-11 ENCOUNTER — Other Ambulatory Visit (HOSPITAL_COMMUNITY)
Admission: RE | Admit: 2023-04-11 | Discharge: 2023-04-11 | Disposition: A | Discharge status: Home / Self Care | Payer: Medicaid Other | Source: Ambulatory Visit | Attending: Obstetrics and Gynecology | Admitting: Obstetrics and Gynecology

## 2023-04-11 DIAGNOSIS — N898 Other specified noninflammatory disorders of vagina: Secondary | ICD-10-CM | POA: Diagnosis not present

## 2023-04-11 NOTE — Progress Notes (Signed)
SUBJECTIVE:  32 y.o. female complains of none vaginal discharge for 1-2 week(s) Pt stating that she feels that her PH is off, Pt requesting swab include STI screen as well, no blood work requested  Denies abnormal vaginal bleeding or significant pelvic pain or fever.Denies history of known exposure to STD.  No LMP recorded.  OBJECTIVE:  She appears alert, well appearing, in no apparent distress   ASSESSMENT:  Vaginal Odor   PLAN:  GC, chlamydia, trichomonas, BVAG, CVAG probe sent to lab. Treatment: To be determined once lab results are received ROV prn if symptoms persist or worsen.

## 2023-04-14 LAB — CERVICOVAGINAL ANCILLARY ONLY
Bacterial Vaginitis (gardnerella): POSITIVE — AB
Candida Glabrata: NEGATIVE
Candida Vaginitis: NEGATIVE
Chlamydia: NEGATIVE
Comment: NEGATIVE
Comment: NEGATIVE
Comment: NEGATIVE
Comment: NEGATIVE
Comment: NEGATIVE
Comment: NORMAL
Neisseria Gonorrhea: NEGATIVE
Trichomonas: NEGATIVE

## 2023-04-14 MED ORDER — METRONIDAZOLE 500 MG PO TABS: 500.0000 mg | ORAL_TABLET | Freq: Two times a day (BID) | ORAL | 0 refills | Status: DC

## 2023-04-14 NOTE — Addendum Note (Signed)
Addended by: Lowrys Bing on: 04/14/2023 05:51 PM   Modules accepted: Orders

## 2023-09-12 ENCOUNTER — Encounter: Payer: Self-pay | Admitting: Nurse Practitioner

## 2023-09-18 IMAGING — US US BREAST*L* LIMITED INC AXILLA
1 series · 10 of 10 positions shown · non-contrast
Comparison: Previous exam(s).

CLINICAL DATA: 30-year-old female presenting for evaluation of
diffuse bilateral lateral breast tenderness and a left breast lump.
Family history breast cancer in the patient's mother who died at age
49 and in maternal grandmother who had breast cancer at age 40.

EXAM:
DIGITAL DIAGNOSTIC BILATERAL MAMMOGRAM WITH TOMOSYNTHESIS AND CAD;
ULTRASOUND LEFT BREAST LIMITED
TECHNIQUE: Bilateral digital diagnostic mammography and breast tomosynthesis
was performed. The images were evaluated with computer-aided
detection.; Targeted ultrasound examination of the left breast was
performed.

[Series 1: us breast*left* limited inc axilla · 0.06mm/px · 10 of 10 slices shown]
[im 1/10]
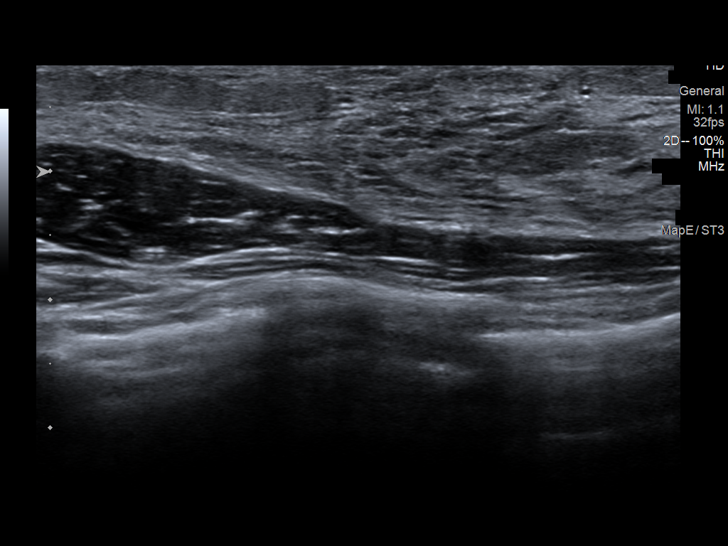
[im 2/10]
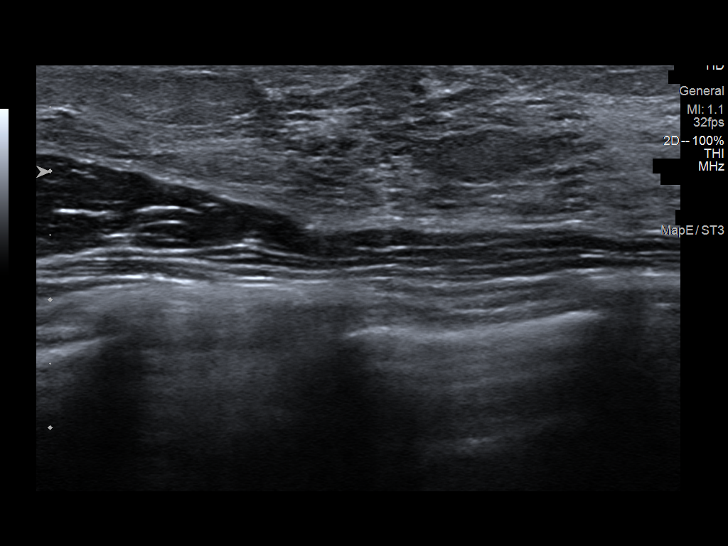
[im 3/10]
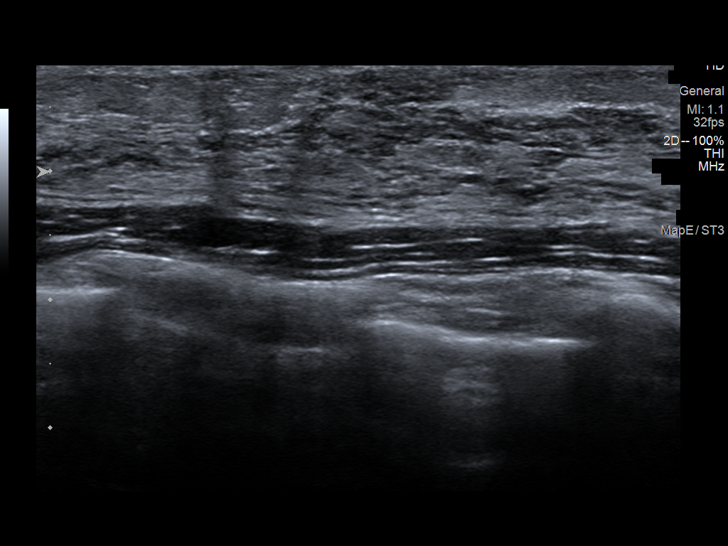
[im 4/10]
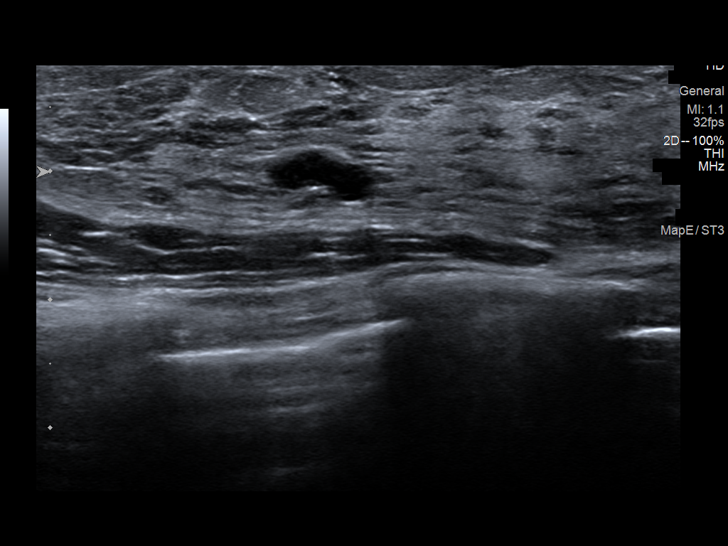
[im 5/10]
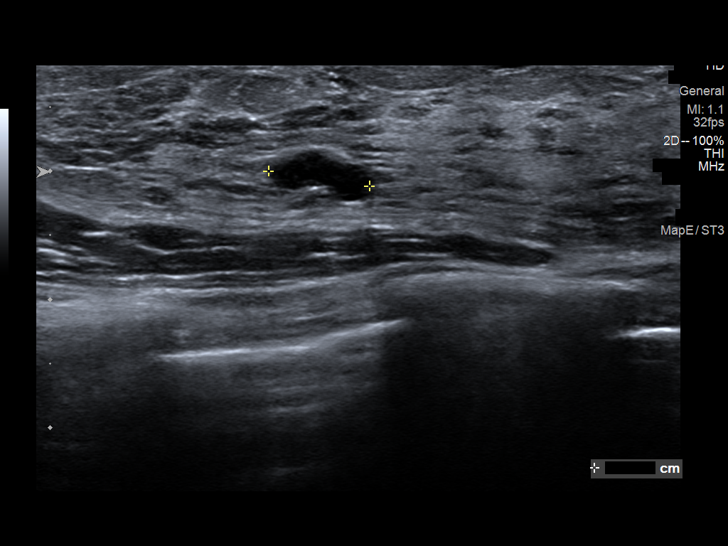
[im 6/10]
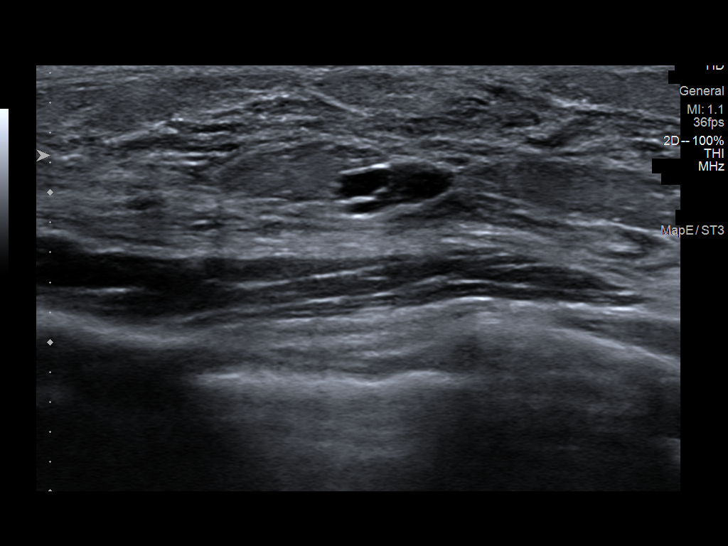
[im 7/10]
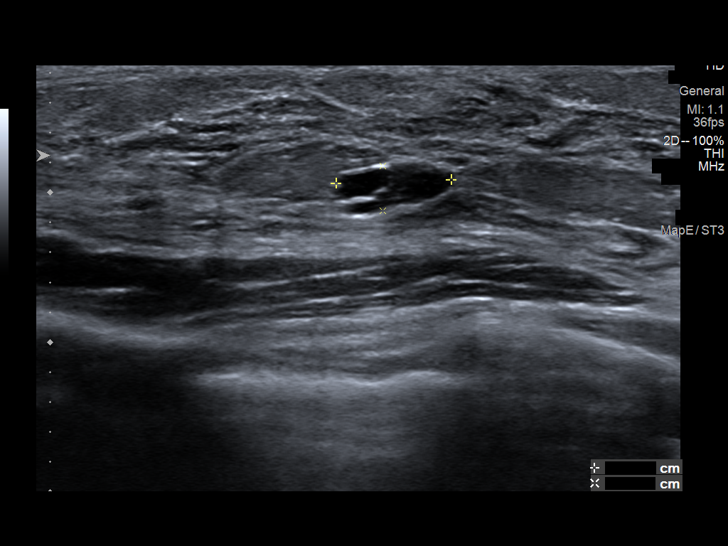
[im 8/10]
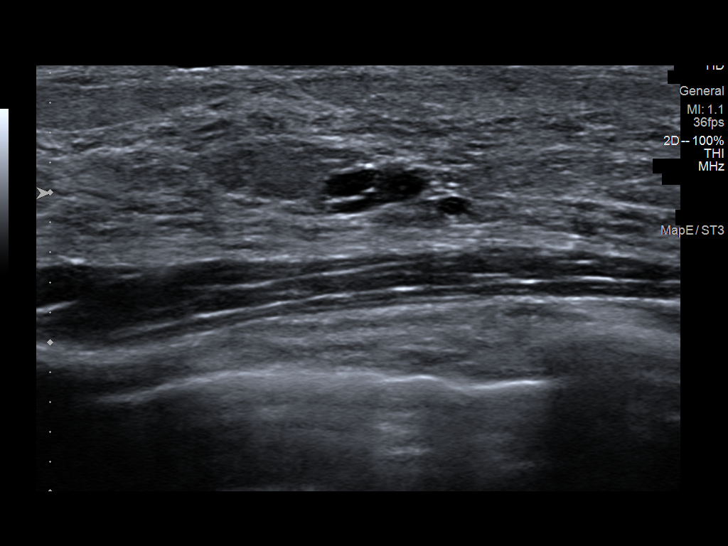
[im 9/10]
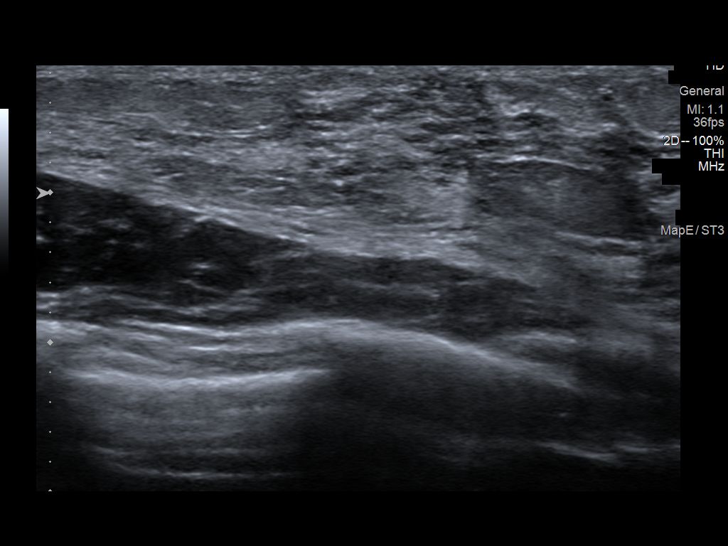
[im 10/10]
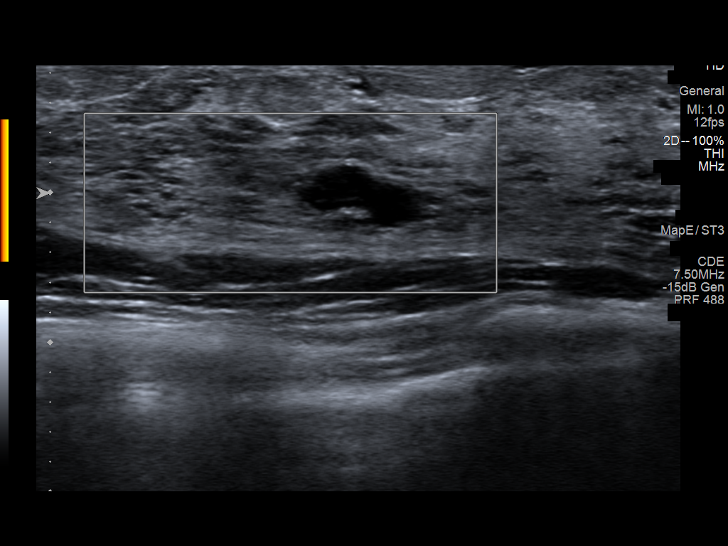

[10 of 10 positions shown; findings below may reference images not displayed]

ACR Breast Density Category d: The breast tissue is extremely dense,
which lowers the sensitivity of mammography.
FINDINGS: Mammogram:

Right breast: No suspicious mass, distortion, or microcalcifications
are identified to suggest presence of malignancy.

Left breast: A skin BB marks the palpable site of concern reported
by the patient in the central superior left breast. A spot
tangential view of this area was performed in addition to standard
views. There is a small oval circumscribed masses deep at the
palpable site measuring approximately 0.5 cm. There are no
additional new findings elsewhere in the left breast.

On physical exam at the site of concern reported by the patient in
the upper left breast I do not feel a fixed discrete mass.

Ultrasound:

Targeted ultrasound performed at the palpable site of concern
reported by the patient in the left breast at 12 o'clock 3 cm from
nipple demonstrating normal fibroglandular tissue. Likely incidental
at 12 o'clock 3 cm from the nipple there is a deep oval
circumscribed near anechoic mass measuring 0.8 x 0.3 x 0.8 cm. This
mass was previously seen on ultrasound from March 01, 2021
measuring 1.0 x 0.4 x 1.2 cm. No internal vascularity. No suspicious
solid mass.
IMPRESSION: 1. No mammographic or sonographic evidence of malignancy at the
palpable site of concern in the superior left breast.

2.  Benign cyst in the left breast at 12 o'clock.

3.  No mammographic evidence of malignancy in the right breast.

RECOMMENDATION:
Consider risk assessment to determine the patient's lifetime risk of
breast cancer given her family history, if this has not already been
performed. Per American Cancer Society guidelines, if the patient
has a calculated lifetime risk of developing breast cancer of
greater than 20%, annual screening MRI of the breasts would be
recommended in addition to routine annual screening mammography.

I have discussed the findings and recommendations with the patient.
If applicable, a reminder letter will be sent to the patient
regarding the next appointment.

BI-RADS CATEGORY  2: Benign.

## 2023-09-18 IMAGING — MG DIGITAL DIAGNOSTIC BILAT W/ TOMO W/ CAD
8 of 14 series · 8 of 40 positions shown · non-contrast
Comparison: Previous exam(s).

CLINICAL DATA: 30-year-old female presenting for evaluation of
diffuse bilateral lateral breast tenderness and a left breast lump.
Family history breast cancer in the patient's mother who died at age
49 and in maternal grandmother who had breast cancer at age 40.

EXAM:
DIGITAL DIAGNOSTIC BILATERAL MAMMOGRAM WITH TOMOSYNTHESIS AND CAD;
ULTRASOUND LEFT BREAST LIMITED
TECHNIQUE: Bilateral digital diagnostic mammography and breast tomosynthesis
was performed. The images were evaluated with computer-aided
detection.; Targeted ultrasound examination of the left breast was
performed.

[R MLO synth-2D]
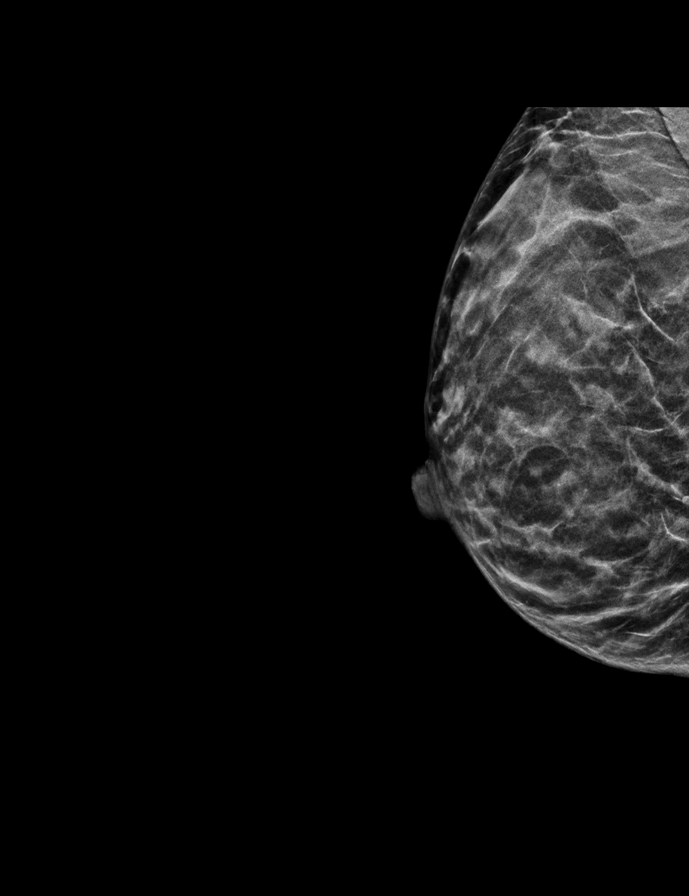

[L MLO synth-2D (1 of 2)]
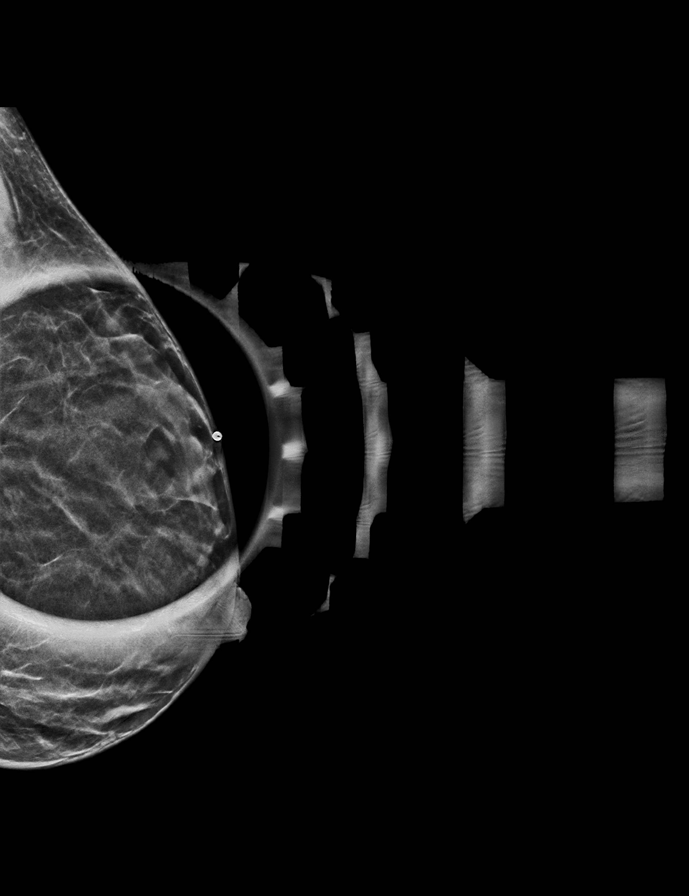

[L CC synth-2D (1 of 2)]
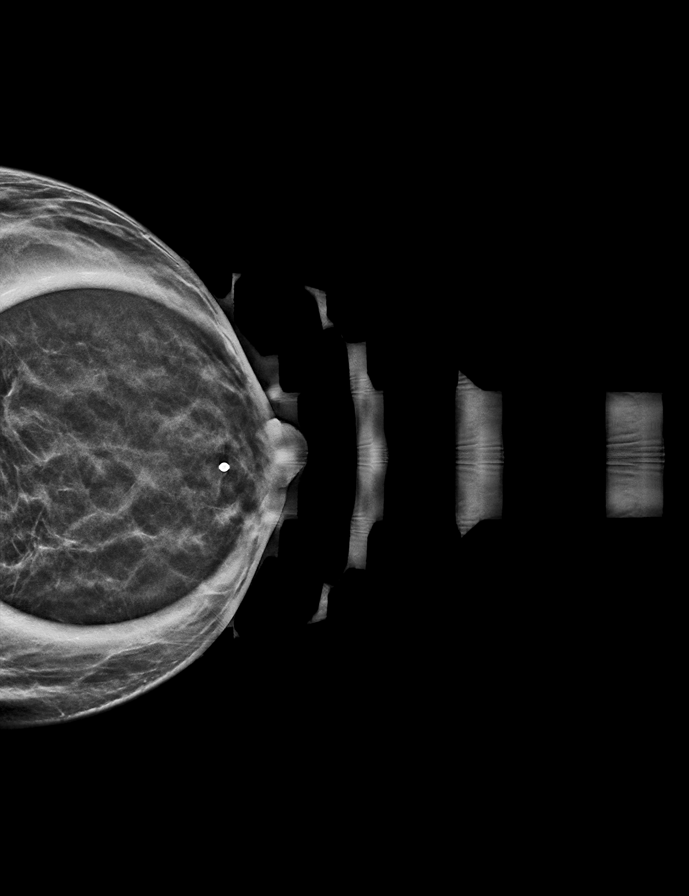

[L ML synth-2D]
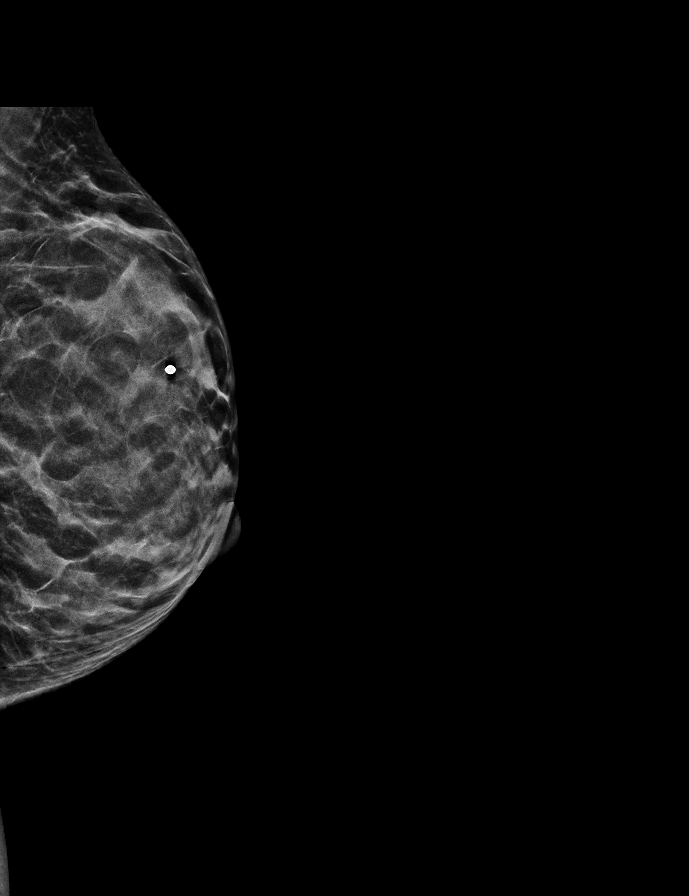

[L MLO synth-2D (2 of 2)]
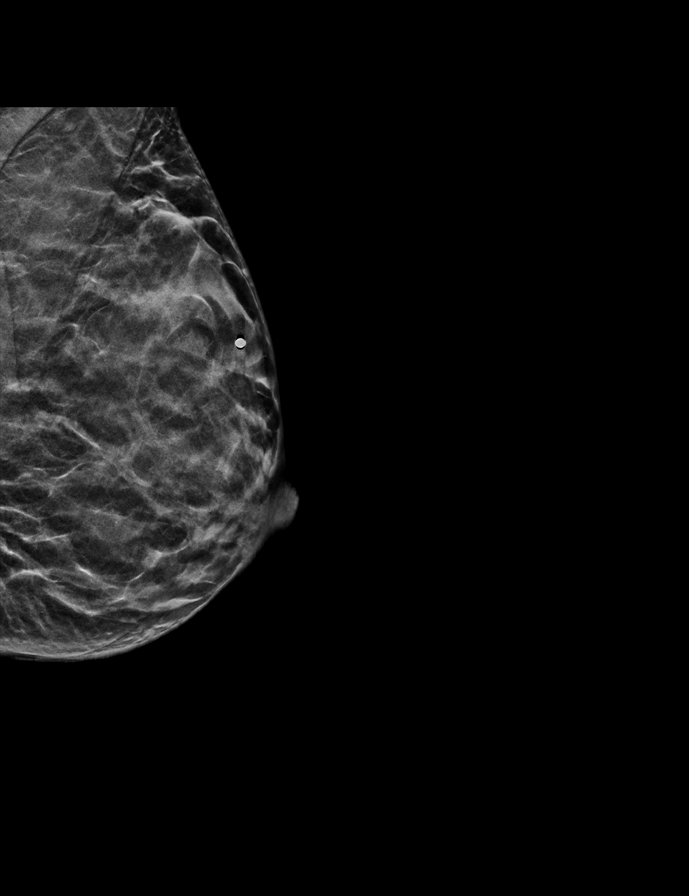

[L CC synth-2D (2 of 2)]
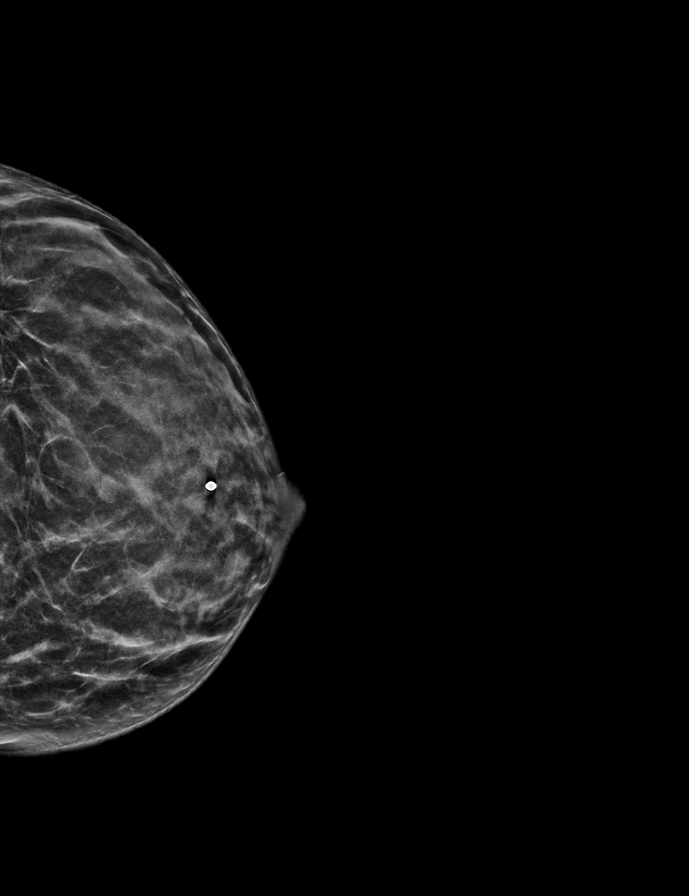

[R CC synth-2D]
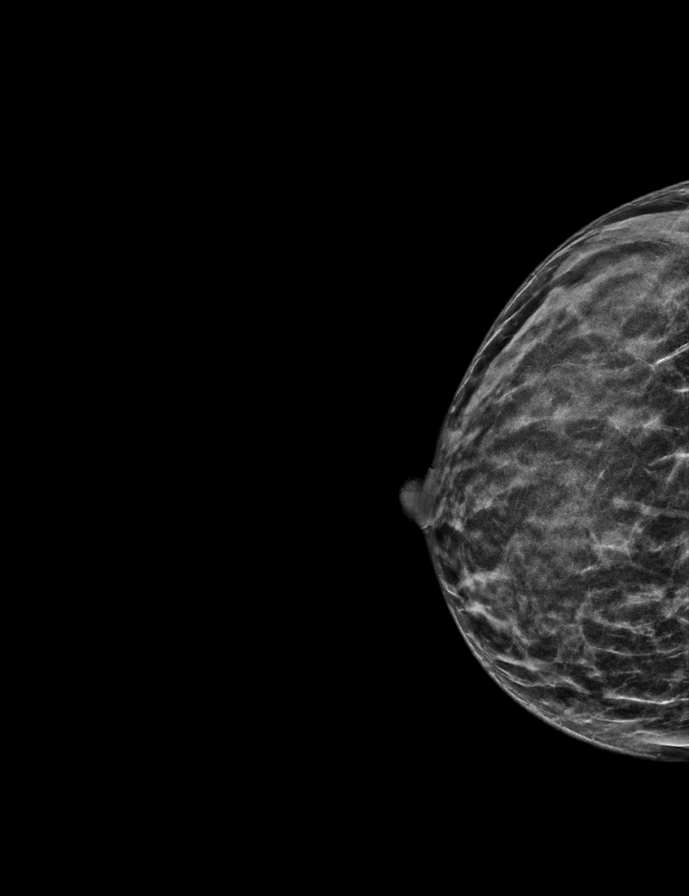

[L MLO tomo · tomo slice 19/36.0]
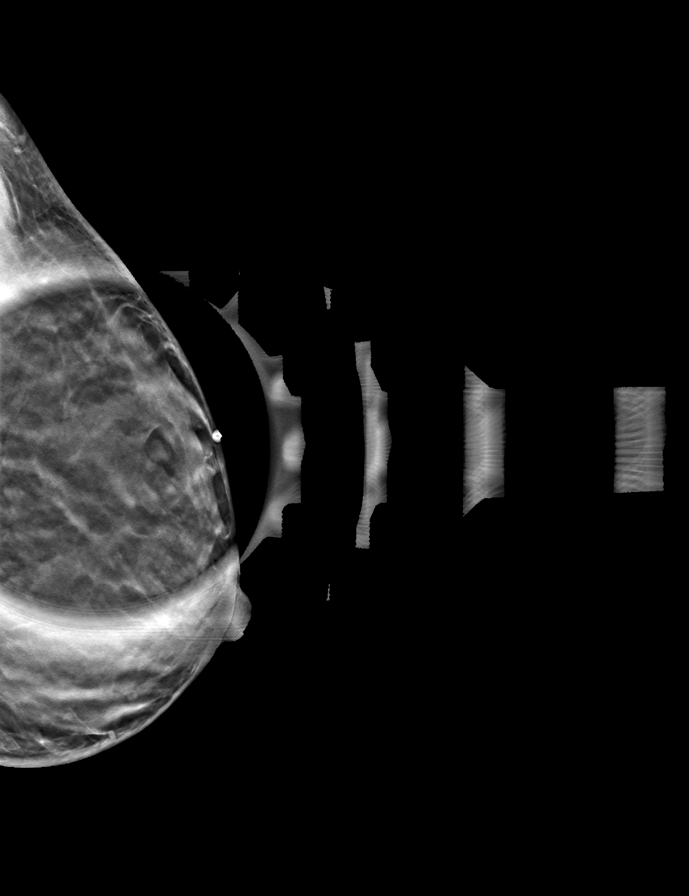

[8 of 40 positions shown; findings below may reference images not displayed]

ACR Breast Density Category d: The breast tissue is extremely dense,
which lowers the sensitivity of mammography.
FINDINGS: Mammogram:

Right breast: No suspicious mass, distortion, or microcalcifications
are identified to suggest presence of malignancy.

Left breast: A skin BB marks the palpable site of concern reported
by the patient in the central superior left breast. A spot
tangential view of this area was performed in addition to standard
views. There is a small oval circumscribed masses deep at the
palpable site measuring approximately 0.5 cm. There are no
additional new findings elsewhere in the left breast.

On physical exam at the site of concern reported by the patient in
the upper left breast I do not feel a fixed discrete mass.

Ultrasound:

Targeted ultrasound performed at the palpable site of concern
reported by the patient in the left breast at 12 o'clock 3 cm from
nipple demonstrating normal fibroglandular tissue. Likely incidental
at 12 o'clock 3 cm from the nipple there is a deep oval
circumscribed near anechoic mass measuring 0.8 x 0.3 x 0.8 cm. This
mass was previously seen on ultrasound from March 01, 2021
measuring 1.0 x 0.4 x 1.2 cm. No internal vascularity. No suspicious
solid mass.
IMPRESSION: 1. No mammographic or sonographic evidence of malignancy at the
palpable site of concern in the superior left breast.

2.  Benign cyst in the left breast at 12 o'clock.

3.  No mammographic evidence of malignancy in the right breast.

RECOMMENDATION:
Consider risk assessment to determine the patient's lifetime risk of
breast cancer given her family history, if this has not already been
performed. Per American Cancer Society guidelines, if the patient
has a calculated lifetime risk of developing breast cancer of
greater than 20%, annual screening MRI of the breasts would be
recommended in addition to routine annual screening mammography.

I have discussed the findings and recommendations with the patient.
If applicable, a reminder letter will be sent to the patient
regarding the next appointment.

BI-RADS CATEGORY  2: Benign.

## 2023-11-03 ENCOUNTER — Ambulatory Visit

## 2023-11-03 ENCOUNTER — Other Ambulatory Visit (HOSPITAL_COMMUNITY)
Admission: RE | Admit: 2023-11-03 | Discharge: 2023-11-03 | Disposition: A | Discharge status: Home / Self Care | Source: Ambulatory Visit | Attending: Obstetrics & Gynecology | Admitting: Obstetrics & Gynecology

## 2023-11-03 DIAGNOSIS — Z113 Encounter for screening for infections with a predominantly sexual mode of transmission: Secondary | ICD-10-CM | POA: Insufficient documentation

## 2023-11-03 NOTE — Addendum Note (Signed)
 Addended by: IVAN SHARENE HERO on: 11/03/2023 03:12 PM   Modules accepted: Orders

## 2023-11-03 NOTE — Progress Notes (Signed)
 SUBJECTIVE:  33 y.o. female who desires a STI screen. Denies abnormal vaginal discharge, bleeding or significant pelvic pain. No UTI symptoms. Denies history of known exposure to STD.  No LMP recorded.  OBJECTIVE:  She appears well.   ASSESSMENT:  STI Screen   PLAN:  Pt offered STI blood screening-requested GC, chlamydia, BV,Yeast and trichomonas probe sent to lab. Per pt request. Treatment: To be determined once lab results are received.  Pt follow up as needed.

## 2023-11-04 ENCOUNTER — Ambulatory Visit: Payer: Self-pay | Admitting: Obstetrics & Gynecology

## 2023-11-04 DIAGNOSIS — B9689 Other specified bacterial agents as the cause of diseases classified elsewhere: Secondary | ICD-10-CM

## 2023-11-04 LAB — RPR+HBSAG+HCVAB+...
HIV Screen 4th Generation wRfx: NONREACTIVE
Hep C Virus Ab: NONREACTIVE
Hepatitis B Surface Ag: NEGATIVE
RPR Ser Ql: NONREACTIVE

## 2023-11-06 LAB — CERVICOVAGINAL ANCILLARY ONLY
Bacterial Vaginitis (gardnerella): POSITIVE — AB
Candida Glabrata: NEGATIVE
Candida Vaginitis: NEGATIVE
Chlamydia: NEGATIVE
Comment: NEGATIVE
Comment: NEGATIVE
Comment: NEGATIVE
Comment: NEGATIVE
Comment: NEGATIVE
Comment: NORMAL
Neisseria Gonorrhea: NEGATIVE
Trichomonas: NEGATIVE

## 2023-11-06 MED ORDER — METRONIDAZOLE 500 MG PO TABS: 500.0000 mg | ORAL_TABLET | Freq: Two times a day (BID) | ORAL | 0 refills | Status: DC

## 2023-11-26 ENCOUNTER — Other Ambulatory Visit (HOSPITAL_COMMUNITY)
Admission: RE | Admit: 2023-11-26 | Discharge: 2023-11-26 | Disposition: A | Discharge status: Home / Self Care | Source: Ambulatory Visit | Attending: Obstetrics and Gynecology | Admitting: Obstetrics and Gynecology

## 2023-11-26 ENCOUNTER — Encounter: Payer: Self-pay | Admitting: Obstetrics and Gynecology

## 2023-11-26 ENCOUNTER — Ambulatory Visit: Admitting: Obstetrics and Gynecology

## 2023-11-26 VITALS — BP 108/72 | HR 72 | Wt 121.0 lb

## 2023-11-26 DIAGNOSIS — Z124 Encounter for screening for malignant neoplasm of cervix: Secondary | ICD-10-CM | POA: Diagnosis not present

## 2023-11-26 DIAGNOSIS — Z113 Encounter for screening for infections with a predominantly sexual mode of transmission: Secondary | ICD-10-CM

## 2023-11-26 DIAGNOSIS — Z01419 Encounter for gynecological examination (general) (routine) without abnormal findings: Secondary | ICD-10-CM | POA: Insufficient documentation

## 2023-11-26 DIAGNOSIS — Z1331 Encounter for screening for depression: Secondary | ICD-10-CM

## 2023-11-26 NOTE — Progress Notes (Signed)
 Patient presents for Annual.  LMP: 11/14/23 Monthly  Last pap: 03/13/21 Contraception:BTL Mammogram: 03/28/23 [facility]  Mother passed of Breast Cancer STD Screening: Yes   CC: One episode of bleeding with intercourse.

## 2023-11-26 NOTE — Progress Notes (Signed)
 WELL-WOMAN PHYSICAL & PAP Patient name: Jeanne Stark MRN [id number]  Date of birth: 1991-03-07 Chief Complaint:   Gynecologic Exam  History of Present Illness:   Jeanne Stark is a 33 y.o. G69P2003 African American female being seen today for a routine well-woman exam.  Current complaints: wanting repeat STI testing d/t timing of last testing to last SI was only 2 days. She feels the testing may have been done too early. She reports she had been abstinent for over a year prior to her last SI. Once she had SI, he started acting weird and giving off different vibes. She is no longer sexually active with anyone.  PCP: Wendee Lynwood HERO, NP       does desire labs Patient's last menstrual period was 11/14/2023. The current method of family planning is abstinence.  Last pap 03/13/2021. Results were: normal Last mammogram: 03/28/2023. Results were: normal. Family h/o breast cancer: Yes -- mother and MGM died from BrCa, Mat. Aunt also has BrCa. Last colonoscopy: n/a. Family h/o colorectal cancer: unsure Review of Systems:   Pertinent items are noted in HPI Denies any headaches, blurred vision, fatigue, shortness of breath, chest pain, abdominal pain, abnormal vaginal discharge/itching/odor/irritation, problems with periods, bowel movements, urination, or intercourse unless otherwise stated above. Pertinent History Reviewed:  Reviewed past medical,surgical, social and family history.  Reviewed problem list, medications and allergies. Physical Assessment:   Vitals:   11/26/23 0818  BP: 108/72  Pulse: 72  Weight: 121 lb (54.9 kg)  Body mass index is 22.13 kg/m.        Physical Examination:   General appearance - well appearing, and in no distress  Mental status - alert, oriented to person, place, and time  Psych:  She has a normal mood and affect  Skin - warm and dry, normal color, no suspicious lesions noted  Chest - effort normal, all lung fields clear to auscultation  bilaterally  Heart - normal rate and regular rhythm  Neck:  midline trachea, no thyromegaly or nodules  Breasts - breasts appear normal, no suspicious masses, no skin or nipple changes or  axillary nodes  Abdomen - soft, nontender, nondistended, no masses or organomegaly  Pelvic - VULVA: normal appearing vulva with no masses, tenderness or lesions  VAGINA: normal appearing vagina with normal color and discharge, no lesions  CERVIX: normal appearing cervix without discharge or lesions, no CMT  Thin prep pap is done with HR HPV cotesting  UTERUS: uterus is felt to be normal size, shape, consistency and nontender   ADNEXA: No adnexal masses or tenderness noted.  Rectal - deferred  Extremities:  No swelling or varicosities noted  No results found for this or any previous visit (from the past 24 hours).  Assessment & Plan:  1) Well woman exam with routine gynecological exam (Primary) - MM 3D SCREENING MAMMOGRAM BILATERAL BREAST; Future - RPR+HBsAg+HCVAb+... - Cytology - PAP ([place])  2) Pap smear for cervical cancer screening - Cytology - PAP ([place])  3) Screening examination for STI - RPR+HBsAg+HCVAb+... - Advised of safe sex practices and to use condoms each and every time SI occurs  Labs/procedures today: pap and STI screening  Mammogram 03/2024 or sooner if problems Colonoscopy at age 12 or sooner if problems  Orders Placed This Encounter  Procedures   MM 3D SCREENING MAMMOGRAM BILATERAL BREAST   RPR+HBsAg+HCVAb+...    Meds: No orders of the defined types were placed in this encounter.  Follow-up: Return in about  1 year (around 11/25/2024) for Annual Exam.  Ala Cart MSN, CNM 11/26/2023 8:47 AM

## 2023-11-27 LAB — RPR+HBSAG+HCVAB+...
HIV Screen 4th Generation wRfx: NONREACTIVE
Hep C Virus Ab: NONREACTIVE
Hepatitis B Surface Ag: NEGATIVE
RPR Ser Ql: NONREACTIVE

## 2023-12-02 ENCOUNTER — Ambulatory Visit: Payer: Self-pay | Admitting: Obstetrics and Gynecology

## 2023-12-05 LAB — CYTOLOGY - PAP
Chlamydia: NEGATIVE
Comment: NEGATIVE
Comment: NEGATIVE
Comment: NEGATIVE
Comment: NEGATIVE
Comment: NORMAL
Diagnosis: NEGATIVE
HSV1: NEGATIVE
HSV2: NEGATIVE
High risk HPV: NEGATIVE
Neisseria Gonorrhea: NEGATIVE
Trichomonas: NEGATIVE

## 2023-12-26 ENCOUNTER — Telehealth: Payer: Self-pay | Admitting: Nurse Practitioner

## 2023-12-26 NOTE — Telephone Encounter (Signed)
 Copied from CRM #[id number]. Topic: Medical Record Request - Records Request >> Dec 26, 2023  1:09 PM [place] J wrote: Reason for CRM: Patient is wanting a copy of her physical notes and TB results from last year sent via MyChart. Her employer is requiring it and the patient starts her new job this upcoming Monday.   Please call the patient with any kind of update on this. She is aware of the next day turn around time.

## 2023-12-29 ENCOUNTER — Telehealth: Payer: Self-pay

## 2023-12-29 NOTE — Telephone Encounter (Signed)
 Patient called back states she found TB results. I was able to send her as attachment in her my chart and patient was able to view while on phone. No further action needed at this time.

## 2023-12-29 NOTE — Telephone Encounter (Signed)
 Left message to return call to our office.

## 2024-07-20 ENCOUNTER — Encounter: Admitting: Nurse Practitioner
